# Patient Record
Sex: Male | Born: 1970 | Race: White | Hispanic: No | Smoking: Current every day smoker
Health system: Southern US, Community
[De-identification: ages and names within clinical notes are randomized; demographics above are authoritative.]

## PROBLEM LIST (undated history)

## (undated) DIAGNOSIS — F419 Anxiety disorder, unspecified: Secondary | ICD-10-CM

## (undated) DIAGNOSIS — S42009A Fracture of unspecified part of unspecified clavicle, initial encounter for closed fracture: Secondary | ICD-10-CM

## (undated) DIAGNOSIS — S2239XA Fracture of one rib, unspecified side, initial encounter for closed fracture: Secondary | ICD-10-CM

## (undated) DIAGNOSIS — F329 Major depressive disorder, single episode, unspecified: Secondary | ICD-10-CM

## (undated) DIAGNOSIS — E663 Overweight: Secondary | ICD-10-CM

## (undated) DIAGNOSIS — R7303 Prediabetes: Secondary | ICD-10-CM

## (undated) DIAGNOSIS — G4700 Insomnia, unspecified: Secondary | ICD-10-CM

## (undated) DIAGNOSIS — Z720 Tobacco use: Secondary | ICD-10-CM

## (undated) DIAGNOSIS — I10 Essential (primary) hypertension: Secondary | ICD-10-CM

## (undated) DIAGNOSIS — F32A Depression, unspecified: Secondary | ICD-10-CM

## (undated) HISTORY — DX: Insomnia, unspecified: G47.00

## (undated) HISTORY — DX: Anxiety disorder, unspecified: F41.9

## (undated) HISTORY — PX: NO PAST SURGERIES: SHX2092

## (undated) HISTORY — DX: Fracture of unspecified part of unspecified clavicle, initial encounter for closed fracture: S42.009A

## (undated) HISTORY — DX: Major depressive disorder, single episode, unspecified: F32.9

## (undated) HISTORY — DX: Overweight: E66.3

## (undated) HISTORY — DX: Prediabetes: R73.03

## (undated) HISTORY — DX: Fracture of one rib, unspecified side, initial encounter for closed fracture: S22.39XA

## (undated) HISTORY — DX: Tobacco use: Z72.0

## (undated) HISTORY — DX: Depression, unspecified: F32.A

---

## 2009-03-08 DIAGNOSIS — F341 Dysthymic disorder: Secondary | ICD-10-CM | POA: Insufficient documentation

## 2010-05-19 ENCOUNTER — Encounter: Payer: Self-pay | Admitting: Emergency Medicine

## 2010-05-19 ENCOUNTER — Ambulatory Visit (INDEPENDENT_AMBULATORY_CARE_PROVIDER_SITE_OTHER): Payer: Commercial Indemnity | Admitting: Emergency Medicine

## 2010-05-19 DIAGNOSIS — J209 Acute bronchitis, unspecified: Secondary | ICD-10-CM

## 2010-05-23 NOTE — Assessment & Plan Note (Signed)
Summary: COUGH,CONGESTION/TJ   Vital Signs:  Patient Profile:   40 Years Old Male CC:      productive cough, runny nose, HA, congestion x 4 days Height:     68 inches Weight:      174 pounds O2 Sat:      97 % O2 treatment:    Room Air Temp:     98.8 degrees F oral Pulse rate:   110 / minute Resp:     16 per minute BP sitting:   117 / 81  (left arm) Cuff size:   regular  Vitals Entered By: Lajean Saver RN (May 19, 2010 7:46 PM)                  Updated Prior Medication List: NYQUIL 60-7.08-22-998 MG/30ML LIQD (PSEUDOEPH-DOXYLAMINE-DM-APAP)  DAYQUIL MULTI-SYMPTOM 30-325-10 MG/15ML LIQD (PSEUDOEPHEDRINE-APAP-DM)   Current Allergies: No known allergies History of Present Illness History from: patient Chief Complaint: productive cough, runny nose, HA, congestion x 4 days History of Present Illness: 40 Years Old Male complains of onset of cold symptoms for 4 days.  Danny Lyons has been using OTC cold meds which is helping a little bit. No sore throat ++ cough No pleuritic pain + wheezing No nasal congestion + post-nasal drainage + sinus pain/pressure + chest congestion No itchy/red eyes No earache No hemoptysis No SOB No chills/sweats No fever No nausea No vomiting No abdominal pain No diarrhea No skin rashes No fatigue No myalgias No headache   REVIEW OF SYSTEMS Constitutional Symptoms      Denies fever, chills, night sweats, weight loss, weight gain, and fatigue.  Eyes       Denies change in vision, eye pain, eye discharge, glasses, contact lenses, and eye surgery. Ear/Nose/Throat/Mouth       Complains of frequent runny nose, sinus problems, and hoarseness.      Denies hearing loss/aids, change in hearing, ear pain, ear discharge, dizziness, frequent nose bleeds, sore throat, and tooth pain or bleeding.  Respiratory       Complains of productive cough.      Denies dry cough, wheezing, shortness of breath, asthma, bronchitis, and emphysema/COPD.    Cardiovascular       Complains of chest pain.      Denies murmurs and tires easily with exhertion.      Comments: pain with cough   Gastrointestinal       Denies stomach pain, nausea/vomiting, diarrhea, constipation, blood in bowel movements, and indigestion. Genitourniary       Denies painful urination, kidney stones, and loss of urinary control. Neurological       Complains of headaches.      Denies paralysis, seizures, and fainting/blackouts. Musculoskeletal       Denies muscle pain, joint pain, joint stiffness, decreased range of motion, redness, swelling, muscle weakness, and gout.  Skin       Denies bruising, unusual mles/lumps or sores, and hair/skin or nail changes.  Psych       Denies mood changes, temper/anger issues, anxiety/stress, speech problems, depression, and sleep problems.  Past History:  Past Medical History: Unremarkable  Past Surgical History: Denies surgical history  Social History: Current Smoker 1PPD Alcohol use-no Drug use-no Smoking Status:  current Drug Use:  no Physical Exam General appearance: well developed, well nourished, mild distress, harsh cough Ears: normal, no lesions or deformities Nasal: clear discharge Oral/Pharynx: pharyngeal erythema without exudate, uvula midline without deviation Chest/Lungs: no rales, wheezes, or rhonchi bilateral, breath sounds equal without effort.  no evidence of pneumonia heard Heart: regular rate and  rhythm, no murmur MSE: oriented to time, place, and person Assessment New Problems: BRONCHITIS, ACUTE (ICD-466.0)  Patient feels improved after breathing treatment.  Less coughing.  Patient Education: Patient and/or caregiver instructed in the following: quit smoking.  Plan New Medications/Changes: PREDNISONE (PAK) 10 MG TABS (PREDNISONE) 6 day pack, use as directed  #1 x 0, 05/19/2010, Hoyt Koch MD ZUTRIPRO 60-4-5 MG/5ML SOLN (PSEUDOEPH-CHLORPHEN-HYDROCOD) 5cc q6 hrs prn  #6oz x 0,  05/19/2010, Hoyt Koch MD ZITHROMAX Z-PAK 250 MG TABS (AZITHROMYCIN) use as directed  #1 x 0, 05/19/2010, Hoyt Koch MD  New Orders: New Patient Level III [id number] Solumedrol up to 125mg  [J2930] Pulse Oximetry (single measurment) [94760] Nebulizer Tx [91478] Admin of Therapeutic Inj  intramuscular or subcutaneous [96372] Planning Comments:   1)  Take the prescribed antibiotic as instructed. 2)  Use nasal saline solution (over the counter) at least 3 times a day. 3)  Use over the counter decongestants like Zyrtec-D every 12 hours as needed to help with congestion. 4)  Can take tylenol every 6 hours or motrin every 8 hours for pain or fever. 5)  Follow up with your primary doctor  if no improvement in 5-7 days, sooner if increasing pain, fever, or new symptoms.   If he calls back Sunday, can give work note for Monday If not improving, consider CXR at that time   The patient and/or caregiver has been counseled thoroughly with regard to medications prescribed including dosage, schedule, interactions, rationale for use, and possible side effects and they verbalize understanding.  Diagnoses and expected course of recovery discussed and will return if not improved as expected or if the condition worsens. Patient and/or caregiver verbalized understanding.  Prescriptions: PREDNISONE (PAK) 10 MG TABS (PREDNISONE) 6 day pack, use as directed  #1 x 0   Entered and Authorized by:   Hoyt Koch MD   Signed by:   Hoyt Koch MD on 05/19/2010   Method used:   Print then Give to Patient   RxID:   [id number] ZUTRIPRO 60-4-5 MG/5ML SOLN (PSEUDOEPH-CHLORPHEN-HYDROCOD) 5cc q6 hrs prn  #6oz x 0   Entered and Authorized by:   Hoyt Koch MD   Signed by:   Hoyt Koch MD on 05/19/2010   Method used:   Print then Give to Patient   RxID:   [id number] VOZDGUYQI Z-PAK 250 MG TABS (AZITHROMYCIN) use as directed  #1 x 0   Entered and Authorized by:   Hoyt Koch MD   Signed by:   Hoyt Koch MD on 05/19/2010   Method used:   Print then Give to Patient   RxID:   [id number]   Medication Administration  Injection # 1:    Medication: Solumedrol up to 125mg     Diagnosis: BRONCHITIS, ACUTE (ICD-466.0)    Route: IM    Site: LUOQ gluteus    Exp Date: 09/23/2012    Lot #: [id number]    Mfr: pfizer    Patient tolerated injection without complications    Given by: Lajean Saver RN (May 19, 2010 7:57 PM)  Medication # 1:    Medication: Albuterol Sulfate Sol 1mg  unit dose    Diagnosis: BRONCHITIS, ACUTE (ICD-466.0)    Dose: 3mg     Route: intranasal    Exp Date: 01/25/2011    Lot #: [id number]    Mfr: mylan    Patient tolerated medication without complications    Given by: Lajean Saver RN (May 19, 2010  7:58 PM)  Orders Added: 1)  New Patient Level III [99203] 2)  Solumedrol up to 125mg  [J2930] 3)  Pulse Oximetry (single measurment) [94760] 4)  Nebulizer Tx [94640] 5)  Admin of Therapeutic Inj  intramuscular or subcutaneous [19147]

## 2010-06-10 ENCOUNTER — Other Ambulatory Visit: Payer: Self-pay | Admitting: Family Medicine

## 2010-06-10 ENCOUNTER — Inpatient Hospital Stay (INDEPENDENT_AMBULATORY_CARE_PROVIDER_SITE_OTHER)
Admission: RE | Admit: 2010-06-10 | Discharge: 2010-06-10 | Disposition: A | Discharge status: Home / Self Care | Payer: Commercial Indemnity | Source: Ambulatory Visit | Attending: Family Medicine | Admitting: Family Medicine

## 2010-06-10 ENCOUNTER — Ambulatory Visit
Admission: RE | Admit: 2010-06-10 | Discharge: 2010-06-10 | Disposition: A | Discharge status: Home / Self Care | Payer: Commercial Indemnity | Source: Ambulatory Visit | Attending: Family Medicine | Admitting: Family Medicine

## 2010-06-10 ENCOUNTER — Encounter: Payer: Self-pay | Admitting: Family Medicine

## 2010-06-10 DIAGNOSIS — J209 Acute bronchitis, unspecified: Secondary | ICD-10-CM

## 2010-06-10 DIAGNOSIS — J42 Unspecified chronic bronchitis: Secondary | ICD-10-CM

## 2010-06-12 ENCOUNTER — Encounter: Payer: Self-pay | Admitting: Family Medicine

## 2010-06-12 ENCOUNTER — Telehealth (INDEPENDENT_AMBULATORY_CARE_PROVIDER_SITE_OTHER): Payer: Self-pay | Admitting: *Deleted

## 2010-06-13 NOTE — Assessment & Plan Note (Signed)
Summary: not better/TM rm 3   Vital Signs:  Patient Profile:   40 Years Old Male CC:      cough/ seen 05/19/10 no better Height:     68 inches Weight:      177.50 pounds O2 Sat:      98 % O2 treatment:    Room Air Temp:     98.1 degrees F oral Pulse rate:   85 / minute Resp:     20 per minute BP sitting:   134 / 97  (left arm) Cuff size:   regular  Vitals Entered By: Clemens Catholic LPN (June 10, 2010 9:07 AM)                  Updated Prior Medication List: No Medications Current Allergies (reviewed today): No known allergies History of Present Illness Chief Complaint: cough/ seen 05/19/10 no better History of Present Illness:  Subjective:  Patient complains of persistent cough, partly productive, not improved since previous visit.  Reports chills and anterior burning sensation in chest.  REVIEW OF SYSTEMS Constitutional Symptoms      Denies fever, chills, night sweats, weight loss, weight gain, and fatigue.  Eyes       Denies change in vision, eye pain, eye discharge, glasses, contact lenses, and eye surgery. Ear/Nose/Throat/Mouth       Complains of frequent runny nose, sinus problems, sore throat, and hoarseness.      Denies hearing loss/aids, change in hearing, ear pain, ear discharge, dizziness, frequent nose bleeds, and tooth pain or bleeding.  Respiratory       Complains of productive cough, wheezing, and shortness of breath.      Denies dry cough, asthma, bronchitis, and emphysema/COPD.  Cardiovascular       Complains of chest pain.      Denies murmurs and tires easily with exhertion.    Gastrointestinal       Denies stomach pain, nausea/vomiting, diarrhea, constipation, blood in bowel movements, and indigestion. Genitourniary       Denies painful urination, kidney stones, and loss of urinary control. Neurological       Denies paralysis, seizures, and fainting/blackouts. Musculoskeletal       Denies muscle pain, joint pain, joint stiffness, decreased range  of motion, redness, swelling, muscle weakness, and gout.  Skin       Denies bruising, unusual mles/lumps or sores, and hair/skin or nail changes.  Psych       Denies mood changes, temper/anger issues, anxiety/stress, speech problems, depression, and sleep problems. Other Comments: pt states that he was seen 05/19/10 and he is no better. still has a productive cough(green phelgm), hoarseness, and his chest is tight. no fever, no OTC meds.    Past History:  Past Medical History: Reviewed history from 05/19/2010 and no changes required. Unremarkable  Past Surgical History: Reviewed history from 05/19/2010 and no changes required. Denies surgical history  Family History: Family History Diabetes 1st degree relative MI  Social History: Current Smoker1/2 PPD Alcohol use-no Drug use-no   Objective:  No acute distress  Eyes:  Pupils are equal, round, and reactive to light and accomdation.  Extraocular movement is intact.  Conjunctivae are not inflamed.  Ears:  Canals normal.  Tympanic membranes normal.   Nose:  Moderately congested; no sinus tenderness Pharynx:  Normal  Neck:  Supple.  No adenopathy is present.   Lungs:  Clear to auscultation.  Breath sounds are equal.  Heart:  Regular rate and rhythm without murmurs, rubs,  or gallops.  Abdomen:  Nontender without masses or hepatosplenomegaly.  Bowel sounds are present.  No CVA or flank tenderness.  Extremities:  No edema.  CBC:  WBC 6.3; Hgb 15.4 Chest X-ray:   IMPRESSION: No active cardiopulmonary disease. Assessment  Assessed BRONCHITIS, ACUTE as deteriorated - Donna Christen MD New Problems: FAMILY HISTORY DIABETES 1ST DEGREE RELATIVE (ICD-V18.0)  ? PERTUSSIS  Plan New Medications/Changes: BENZONATATE 200 MG CAPS (BENZONATATE) One by mouth hs as needed cough  #12 x 0, 06/10/2010, Donna Christen MD CLARITHROMYCIN 500 MG TABS (CLARITHROMYCIN) One Tab by mouth two times a day  #20 x 0, 06/10/2010, Donna Christen MD  New  Orders: CBC w/Diff [04540-98119] T-Chest x-ray, 2 views [71020] Pulse Oximetry (single measurment) [94760] Est. Patient Level III [14782] Planning Comments:   Begin Biaxin, expectorant/decongestant, cough suppressant at bedtime.  Increase fluid intake Followup with PCP if not improving 7 to 10 days   The patient and/or caregiver has been counseled thoroughly with regard to medications prescribed including dosage, schedule, interactions, rationale for use, and possible side effects and they verbalize understanding.  Diagnoses and expected course of recovery discussed and will return if not improved as expected or if the condition worsens. Patient and/or caregiver verbalized understanding.  Prescriptions: BENZONATATE 200 MG CAPS (BENZONATATE) One by mouth hs as needed cough  #12 x 0   Entered and Authorized by:   Donna Christen MD   Signed by:   Donna Christen MD on 06/10/2010   Method used:   Print then Give to Patient   RxID:   [id number] CLARITHROMYCIN 500 MG TABS (CLARITHROMYCIN) One Tab by mouth two times a day  #20 x 0   Entered and Authorized by:   Donna Christen MD   Signed by:   Donna Christen MD on 06/10/2010   Method used:   Print then Give to Patient   RxID:   [id number]   Patient Instructions: 1)  Take Mucinex D (guaifenesin with decongestant) twice daily for congestion. 2)  Increase fluid intake, rest. 3)  May use Afrin nasal spray (or generic oxymetazoline) twice daily for about 5 days.  Also recommend using saline nasal spray several times daily and/or saline nasal irrigation. 4)  Followup with family doctor if not improving 7 to 10 days.   Orders Added: 1)  CBC w/Diff [25366-44034] 2)  T-Chest x-ray, 2 views [71020] 3)  Pulse Oximetry (single measurment) [94760] 4)  Est. Patient Level III [74259]

## 2010-06-22 NOTE — Progress Notes (Signed)
  Phone Note Outgoing Call Call back at [facility] Phone [phone]   Call placed by: Lajean Saver RN,  June 12, 2010 11:17 AM Call placed to: Patient Summary of Call: Callback: No answer. Message left with reason for call and call back with questions or concerns, finish antibiotic.

## 2013-11-23 DIAGNOSIS — R748 Abnormal levels of other serum enzymes: Secondary | ICD-10-CM | POA: Insufficient documentation

## 2013-11-23 DIAGNOSIS — E872 Acidosis, unspecified: Secondary | ICD-10-CM | POA: Insufficient documentation

## 2015-03-27 DIAGNOSIS — S42009A Fracture of unspecified part of unspecified clavicle, initial encounter for closed fracture: Secondary | ICD-10-CM

## 2015-03-27 DIAGNOSIS — S2239XA Fracture of one rib, unspecified side, initial encounter for closed fracture: Secondary | ICD-10-CM

## 2015-03-27 HISTORY — DX: Fracture of unspecified part of unspecified clavicle, initial encounter for closed fracture: S42.009A

## 2015-03-27 HISTORY — DX: Fracture of one rib, unspecified side, initial encounter for closed fracture: S22.39XA

## 2016-11-01 ENCOUNTER — Telehealth: Payer: Self-pay | Admitting: Emergency Medicine

## 2016-11-01 ENCOUNTER — Encounter: Payer: Self-pay | Admitting: *Deleted

## 2016-11-01 ENCOUNTER — Emergency Department (INDEPENDENT_AMBULATORY_CARE_PROVIDER_SITE_OTHER)
Admission: EM | Admit: 2016-11-01 | Discharge: 2016-11-01 | Disposition: A | Discharge status: Home / Self Care | Payer: Managed Care, Other (non HMO) | Source: Home / Self Care | Attending: Family Medicine | Admitting: Family Medicine

## 2016-11-01 DIAGNOSIS — G5603 Carpal tunnel syndrome, bilateral upper limbs: Secondary | ICD-10-CM

## 2016-11-01 DIAGNOSIS — B9789 Other viral agents as the cause of diseases classified elsewhere: Secondary | ICD-10-CM

## 2016-11-01 DIAGNOSIS — J069 Acute upper respiratory infection, unspecified: Secondary | ICD-10-CM

## 2016-11-01 DIAGNOSIS — H6591 Unspecified nonsuppurative otitis media, right ear: Secondary | ICD-10-CM | POA: Diagnosis not present

## 2016-11-01 HISTORY — DX: Essential (primary) hypertension: I10

## 2016-11-01 MED ORDER — GUAIFENESIN-CODEINE 100-10 MG/5ML PO SOLN: ORAL | 0 refills | Status: DC

## 2016-11-01 MED ORDER — PREDNISONE 20 MG PO TABS: ORAL_TABLET | ORAL | 0 refills | Status: DC

## 2016-11-01 MED ORDER — ONDANSETRON 4 MG PO TBDP
4.0000 mg | ORAL_TABLET | Freq: Once | ORAL | Status: AC
  Administered 2016-11-01: 4 mg via ORAL

## 2016-11-01 MED ORDER — AMOXICILLIN 875 MG PO TABS: 875.0000 mg | ORAL_TABLET | Freq: Two times a day (BID) | ORAL | 0 refills | Status: DC

## 2016-11-01 NOTE — ED Provider Notes (Addendum)
[facility]    CSN: [id number] Arrival date & time: 11/01/16  1440     History   Chief Complaint Chief Complaint  Patient presents with  . Otalgia  . Emesis    HPI Danny Lyons is a 46 y.o. male.   Patient presents with several concerns: 1)  He has pain in both wrists, worse on the right, and tingling in his hands. 2)  He complains of right ear pain for 2 to 3 days, in addition to sinus congestion, cough, fatigue, chills, and myalgias.  He complains of shortness of breath and wheezing.  He has a past history of asthma as a child.  He has had several episodes of nausea/vomiting.   The history is provided by the patient.    Past Medical History:  Diagnosis Date  . Hypertension     Patient Active Problem List   Diagnosis Date Noted  . BRONCHITIS, ACUTE 05/19/2010    History reviewed. No pertinent surgical history.     Home Medications    Prior to Admission medications   Medication Sig Start Date End Date Taking? Authorizing Provider  amoxicillin (AMOXIL) 875 MG tablet Take 1 tablet (875 mg total) by mouth 2 (two) times daily. 11/01/16   Lattie Haw, MD  guaiFENesin-codeine 100-10 MG/5ML syrup Take 10mL by mouth at bedtime as needed for cough 11/01/16   Lattie Haw, MD  predniSONE (DELTASONE) 20 MG tablet Take one tab by mouth twice daily for 5 days, then one daily. Take with food. 11/01/16   Lattie Haw, MD    Family History Family History  Problem Relation Age of Onset  . Diabetes Mother   . Hypertension Mother   . Heart attack Mother   . Hypertension Father   . Diabetes Father   . Heart attack Father     Social History Social History  Substance Use Topics  . Smoking status: Current Every Day Smoker    Packs/day: 1.00    Types: Cigarettes  . Smokeless tobacco: Never Used  . Alcohol use No     Allergies   Patient has no known allergies.   Review of Systems Review of Systems  No sore throat + cough No pleuritic pain +  wheezing + nasal congestion + post-nasal drainage + sinus pain/pressure No itchy/red eyes + earache No hemoptysis No SOB ? fever, + chills/sweats + nausea + vomiting No abdominal pain No diarrhea No urinary symptoms No skin rash + fatigue + myalgias + headache + bilateral wrist pain Used OTC meds without relief    Physical Exam Triage Vital Signs ED Triage Vitals [11/01/16 1511]  Enc Vitals Group     BP (!) 141/91     Pulse Rate (!) 102     Resp      Temp 98.3 F (36.8 C)     Temp Source Oral     SpO2 96 %     Weight 176 lb (79.8 kg)     Height      Head Circumference      Peak Flow      Pain Score 8     Pain Loc      Pain Edu?      Excl. in GC?    No data found.   Updated Vital Signs BP (!) 141/91 (BP Location: Left Arm)   Pulse (!) 102   Temp 98.3 F (36.8 C) (Oral)   Wt 176 lb (79.8 kg)   SpO2 96%  BMI 26.76 kg/m   Visual Acuity Right Eye Distance:   Left Eye Distance:   Bilateral Distance:    Right Eye Near:   Left Eye Near:    Bilateral Near:     Physical Exam Nursing notes and Vital Signs reviewed. Appearance:  Patient appears stated age, and in no acute distress Eyes:  Pupils are equal, round, and reactive to light and accomodation.  Extraocular movement is intact.  Conjunctivae are not inflamed  Ears:  Canals normal.  Right tympanic membrane is erythematous with poor landmarks.  Left tympanic membrane normal. Nose:  Congested turbinates.  No sinus tenderness.    Pharynx:  Normal Neck:  Supple.  Enlarged posterior/lateral nodes are palpated bilaterally, tender to palpation on the left.   Lungs:  Course breath sounds.  Breath sounds are equal.  Moving air well. Heart:  Regular rate and rhythm without murmurs, rubs, or gallops.  Abdomen:  Nontender without masses or hepatosplenomegaly.  Bowel sounds are present.  No CVA or flank tenderness.  Extremities:  Tenderness to palpation both wrists with decreased range of motion.  Tinel's and  Phalen's tests positive right wrist. Skin:  No rash present.    UC Treatments / Results  Labs (all labs ordered are listed, but only abnormal results are displayed) Labs Reviewed - No data to display  EKG  EKG Interpretation None       Radiology No results found.  Procedures Procedures (including critical care time)  Medications Ordered in UC Medications  ondansetron (ZOFRAN-ODT) disintegrating tablet 4 mg (4 mg Oral Given 11/01/16 1515)     Initial Impression / Assessment and Plan / UC Course  I have reviewed the triage vital signs and the nursing notes.  Pertinent labs & imaging results that were available during my care of the patient were reviewed by me and considered in my medical decision making (see chart for details).     Administered Zofran ODT 4mg  PO Patient has a past history of asthma as a child.  Begin prednisone burst/taper. Begin amoxicillin 875mg  BID.  Rx for Robitussin AC for night time cough.  Dispensed bilateral wrist splints.    Take plain guaifenesin (1200mg  extended release tabs such as Mucinex) twice daily, with plenty of water, for cough and congestion.  Get adequate rest.   May use Afrin nasal spray (or generic oxymetazoline) each morning for about 5 days and then discontinue.  Also recommend using saline nasal spray several times daily and saline nasal irrigation (AYR is a common brand).  Use Flonase nasal spray each morning after using Afrin nasal spray and saline nasal irrigation. Try warm salt water gargles for sore throat.  Stop all antihistamines for now, and other non-prescription cough/cold preparations.   Apply ice pack to wrists for 15 to 20 minutes, 2 to 3 times daily  Continue until pain and swelling decrease.   Followup with Dr. Rodney Langton or Dr. Clementeen [facility] ([facility]) for further management of chronic problems     Final Clinical Impressions(s) / UC Diagnoses   Final diagnoses:  Right otitis media with  effusion  Viral URI with cough  Bilateral carpal tunnel syndrome    New Prescriptions New Prescriptions   AMOXICILLIN (AMOXIL) 875 MG TABLET    Take 1 tablet (875 mg total) by mouth 2 (two) times daily.   GUAIFENESIN-CODEINE 100-10 MG/5ML SYRUP    Take 10mL by mouth at bedtime as needed for cough   PREDNISONE (DELTASONE) 20 MG TABLET  Take one tab by mouth twice daily for 5 days, then one daily. Take with food.   Controlled Substance Prescriptions I have consulted the [place] Controlled Substances Registry for this patient, and feel the risk/benefit ratio today is favorable for proceeding with this prescription for a controlled substance.       Lattie HawBeese, Aavya Shafer A, MD 11/12/16 1507    Lattie HawBeese, Louvina Cleary A, MD 11/12/16 [phone]

## 2016-11-01 NOTE — Telephone Encounter (Signed)
Zofran called to [facility] [place]. AP,CMA

## 2016-11-01 NOTE — ED Triage Notes (Signed)
Patient c/o right ear pain x 3 days. Vomiting and cough x 2 days. C/o chills and sweats.

## 2016-11-01 NOTE — Discharge Instructions (Signed)
Take plain guaifenesin (1200mg  extended release tabs such as Mucinex) twice daily, with plenty of water, for cough and congestion.  Get adequate rest.   May use Afrin nasal spray (or generic oxymetazoline) each morning for about 5 days and then discontinue.  Also recommend using saline nasal spray several times daily and saline nasal irrigation (AYR is a common brand).  Use Flonase nasal spray each morning after using Afrin nasal spray and saline nasal irrigation. Try warm salt water gargles for sore throat.  Stop all antihistamines for now, and other non-prescription cough/cold preparations.   Apply ice pack to wrists for 15 to 20 minutes, 2 to 3 times daily  Continue until pain and swelling decrease.

## 2016-11-08 ENCOUNTER — Emergency Department (INDEPENDENT_AMBULATORY_CARE_PROVIDER_SITE_OTHER)
Admission: EM | Admit: 2016-11-08 | Discharge: 2016-11-08 | Disposition: A | Discharge status: Home / Self Care | Payer: Managed Care, Other (non HMO) | Source: Home / Self Care | Attending: Family Medicine | Admitting: Family Medicine

## 2016-11-08 ENCOUNTER — Encounter: Payer: Self-pay | Admitting: Emergency Medicine

## 2016-11-08 DIAGNOSIS — H6691 Otitis media, unspecified, right ear: Secondary | ICD-10-CM | POA: Diagnosis not present

## 2016-11-08 DIAGNOSIS — J040 Acute laryngitis: Secondary | ICD-10-CM

## 2016-11-08 MED ORDER — GUAIFENESIN-CODEINE 100-10 MG/5ML PO SYRP: 10.0000 mL | ORAL_SOLUTION | Freq: Two times a day (BID) | ORAL | 0 refills | Status: DC | PRN

## 2016-11-08 MED ORDER — CEFTRIAXONE SODIUM 1 G IJ SOLR
1.0000 g | Freq: Once | INTRAMUSCULAR | Status: AC
  Administered 2016-11-08: 1 g via INTRAMUSCULAR

## 2016-11-08 MED ORDER — KETOROLAC TROMETHAMINE 60 MG/2ML IM SOLN
60.0000 mg | Freq: Once | INTRAMUSCULAR | Status: AC
  Administered 2016-11-08: 60 mg via INTRAMUSCULAR

## 2016-11-08 MED ORDER — PREDNISONE 20 MG PO TABS: ORAL_TABLET | ORAL | 0 refills | Status: DC

## 2016-11-08 MED ORDER — CEFDINIR 300 MG PO CAPS: 300.0000 mg | ORAL_CAPSULE | Freq: Two times a day (BID) | ORAL | 0 refills | Status: DC

## 2016-11-08 NOTE — ED Provider Notes (Signed)
[facility]    CSN: [id number] Arrival date & time: 11/08/16  4098     History   Chief Complaint Chief Complaint  Patient presents with  . Otalgia    HPI Danny Lyons is a 46 y.o. male.   HPI  Danny Lyons is a 46 y.o. male presenting to UC with c/o worsening Right ear pain and now lost his voice over the last 1 week. Pt was seen at [facility] on 11/01/16, dx with Right AOM and viral cough. He was prescribed Amoxicillin 875mg , prednisone and guaifenesin-codeine (Virtussin).  He states the pain his in his ear has only worsened. Hx of recurrent Right ear infections. Last one before 1 week ago was a few months ago. Denies fever but has had chills. Denies n/v/d. He has had fatigue. He had to call out of work today and plans to be out tomorrow as well due to feeling so bad.   Past Medical History:  Diagnosis Date  . Hypertension     Patient Active Problem List   Diagnosis Date Noted  . BRONCHITIS, ACUTE 05/19/2010    History reviewed. No pertinent surgical history.     Home Medications    Prior to Admission medications   Medication Sig Start Date End Date Taking? Authorizing Provider  amoxicillin (AMOXIL) 875 MG tablet Take 1 tablet (875 mg total) by mouth 2 (two) times daily. 11/01/16   Lattie Haw, MD  cefdinir (OMNICEF) 300 MG capsule Take 1 capsule (300 mg total) by mouth 2 (two) times daily. 11/08/16   Lurene Shadow, PA-C  guaiFENesin-codeine (ROBITUSSIN AC) 100-10 MG/5ML syrup Take 10 mLs by mouth 2 (two) times daily as needed for cough. 11/08/16   Lurene Shadow, PA-C  guaiFENesin-codeine 100-10 MG/5ML syrup Take 10mL by mouth at bedtime as needed for cough 11/01/16   Lattie Haw, MD  predniSONE (DELTASONE) 20 MG tablet 3 tabs po day one, then 2 po daily x 4 days 11/08/16   Lurene Shadow, PA-C    Family History Family History  Problem Relation Age of Onset  . Diabetes Mother   . Hypertension Mother   . Heart attack Mother   . Hypertension Father   .  Diabetes Father   . Heart attack Father     Social History Social History  Substance Use Topics  . Smoking status: Current Every Day Smoker    Packs/day: 1.00    Types: Cigarettes  . Smokeless tobacco: Never Used  . Alcohol use No     Allergies   Patient has no known allergies.   Review of Systems Review of Systems  Constitutional: Positive for chills and fatigue. Negative for fever.  HENT: Positive for congestion, ear pain (Right), postnasal drip and voice change. Negative for sore throat and trouble swallowing.   Respiratory: Positive for cough. Negative for shortness of breath.   Cardiovascular: Negative for chest pain and palpitations.  Gastrointestinal: Negative for abdominal pain, diarrhea, nausea and vomiting.  Musculoskeletal: Negative for arthralgias, back pain and myalgias.  Skin: Negative for rash.  Neurological: Positive for headaches. Negative for dizziness and light-headedness.  All other systems reviewed and are negative.    Physical Exam Triage Vital Signs ED Triage Vitals  Enc Vitals Group     BP 11/08/16 1024 129/84     Pulse Rate 11/08/16 1024 80     Resp --      Temp 11/08/16 1024 98.1 F (36.7 C)     Temp Source 11/08/16  1024 Oral     SpO2 11/08/16 1024 98 %     Weight 11/08/16 1024 174 lb (78.9 kg)     Height --      Head Circumference --      Peak Flow --      Pain Score 11/08/16 1025 5     Pain Loc --      Pain Edu? --      Excl. in GC? --    No data found.   Updated Vital Signs BP 129/84 (BP Location: Right Arm)   Pulse 80   Temp 98.1 F (36.7 C) (Oral)   Wt 174 lb (78.9 kg)   SpO2 98%   BMI 26.46 kg/m   Visual Acuity Right Eye Distance:   Left Eye Distance:   Bilateral Distance:    Right Eye Near:   Left Eye Near:    Bilateral Near:     Physical Exam  Constitutional: He is oriented to person, place, and time. He appears well-developed and well-nourished. No distress.  HENT:  Head: Normocephalic and atraumatic.    Right Ear: Tympanic membrane is erythematous and bulging. Tympanic membrane is not perforated.  Left Ear: Tympanic membrane normal.  Nose: Nose normal.  Mouth/Throat: Uvula is midline, oropharynx is clear and moist and mucous membranes are normal.  Eyes: EOM are normal.  Neck: Normal range of motion. Neck supple.  Hoarse voice but no stridor.  Cardiovascular: Normal rate and regular rhythm.   Pulmonary/Chest: Effort normal and breath sounds normal. No stridor. No respiratory distress. He has no wheezes. He has no rales.  Musculoskeletal: Normal range of motion.  Lymphadenopathy:    He has cervical adenopathy (Right preauricular).  Neurological: He is alert and oriented to person, place, and time.  Skin: Skin is warm and dry. He is not diaphoretic.  Psychiatric: He has a normal mood and affect. His behavior is normal.  Nursing note and vitals reviewed.    UC Treatments / Results  Labs (all labs ordered are listed, but only abnormal results are displayed) Labs Reviewed - No data to display  EKG  EKG Interpretation None       Radiology No results found.  Procedures Procedures (including critical care time)  Medications Ordered in UC Medications  cefTRIAXone (ROCEPHIN) injection 1 g (1 g Intramuscular Given 11/08/16 1045)  ketorolac (TORADOL) injection 60 mg (60 mg Intramuscular Given 11/08/16 1046)     Initial Impression / Assessment and Plan / UC Course  I have reviewed the triage vital signs and the nursing notes.  Pertinent labs & imaging results that were available during my care of the patient were reviewed by me and considered in my medical decision making (see chart for details).     Pt c/o worsening symptoms despite taking his Amoxicillin as prescribed since 11/01/16.    Rocephin 1g IM given in UC   Final Clinical Impressions(s) / UC Diagnoses   Final diagnoses:  Right acute otitis media  Laryngitis   Will refill prednisone for 1 week, change  antibiotic to Cefdinir and refill small amount, 60mL of the Virtussin.  Work note provided for today and tomorrow. Encouraged fluids and rest, acetaminophen and ibuprofen F/u with PCP in 1 week if not improving Due to reports of recurrent infections in Right ear, resource guide for ENT also provided.   New Prescriptions Discharge Medication List as of 11/08/2016 10:40 AM    START taking these medications   Details  cefdinir (OMNICEF) 300 MG capsule  Take 1 capsule (300 mg total) by mouth 2 (two) times daily., Starting Thu 11/08/2016, Normal    !! guaiFENesin-codeine (ROBITUSSIN AC) 100-10 MG/5ML syrup Take 10 mLs by mouth 2 (two) times daily as needed for cough., Starting Thu 11/08/2016, Print     !! - Potential duplicate medications found. Please discuss with provider.       Controlled Substance Prescriptions [place] Controlled Substance Registry consulted? Yes, I have consulted the [place] Controlled Substances Registry for this patient, and feel the risk/benefit ratio today is favorable for proceeding with this prescription for a controlled substance.   Lurene Shadowhelps, Brinly Maietta O, [place]PA-C 11/08/16 1143

## 2016-11-08 NOTE — Discharge Instructions (Signed)
°  Virtussin (guaifenesin-codeine) is a strong narcotic cough medication.  Only take up to 3 times daily as needed for severe cough.  Be sure to take with large glass of water.  It may cause drowsiness. Do not drive, drink alcohol or take other sedating medications such as Nyquil while taking this medication.   You may take 500mg  acetaminophen every 4-6 hours or in combination with ibuprofen 400-600mg  every 6-8 hours as needed for pain and inflammation.

## 2016-11-08 NOTE — ED Triage Notes (Signed)
Pt seen 11/01/16 with c/o right ear pain, cough and chills. States sxs have worsened. He is taking amox, prednisone and cough syrup with no relief. Now c/o laryngitis.

## 2016-11-09 ENCOUNTER — Ambulatory Visit (INDEPENDENT_AMBULATORY_CARE_PROVIDER_SITE_OTHER): Payer: Managed Care, Other (non HMO) | Admitting: Physician Assistant

## 2016-11-09 ENCOUNTER — Encounter: Payer: Self-pay | Admitting: Physician Assistant

## 2016-11-09 VITALS — BP 164/107 | HR 71 | Temp 99.1°F | Ht 68.0 in | Wt 170.0 lb

## 2016-11-09 DIAGNOSIS — I10 Essential (primary) hypertension: Secondary | ICD-10-CM

## 2016-11-09 DIAGNOSIS — Z87898 Personal history of other specified conditions: Secondary | ICD-10-CM | POA: Diagnosis not present

## 2016-11-09 DIAGNOSIS — Z13220 Encounter for screening for lipoid disorders: Secondary | ICD-10-CM

## 2016-11-09 DIAGNOSIS — Z136 Encounter for screening for cardiovascular disorders: Secondary | ICD-10-CM

## 2016-11-09 DIAGNOSIS — R509 Fever, unspecified: Secondary | ICD-10-CM

## 2016-11-09 DIAGNOSIS — F331 Major depressive disorder, recurrent, moderate: Secondary | ICD-10-CM

## 2016-11-09 DIAGNOSIS — F17210 Nicotine dependence, cigarettes, uncomplicated: Secondary | ICD-10-CM

## 2016-11-09 DIAGNOSIS — Z833 Family history of diabetes mellitus: Secondary | ICD-10-CM | POA: Diagnosis not present

## 2016-11-09 DIAGNOSIS — Z7689 Persons encountering health services in other specified circumstances: Secondary | ICD-10-CM

## 2016-11-09 DIAGNOSIS — Z8249 Family history of ischemic heart disease and other diseases of the circulatory system: Secondary | ICD-10-CM

## 2016-11-09 LAB — POCT GLYCOSYLATED HEMOGLOBIN (HGB A1C): HEMOGLOBIN A1C: 5.7

## 2016-11-09 MED ORDER — CITALOPRAM HYDROBROMIDE 20 MG PO TABS: 20.0000 mg | ORAL_TABLET | Freq: Every day | ORAL | 1 refills | Status: DC

## 2016-11-09 MED ORDER — CANDESARTAN CILEXETIL 32 MG PO TABS: 32.0000 mg | ORAL_TABLET | Freq: Every day | ORAL | 1 refills | Status: DC

## 2016-11-09 NOTE — Patient Instructions (Addendum)
For your blood pressure: - Start Candesartan every morning - Limit salt to 1500mg  per day - Reduce number of cigarettes your are smoking - Limit alcohol to no more than 1 standard drink per day - Follow DASH eating plan - Follow-up in 4 weeks   For mood: - start Celexa every morning or at bedtime - Follow-up in 4 weeks  For prediabetes: - measure and limit your carbohydrates - avoid sugary beverages - regular cardiovascular exercise  Please go to the lab for fasting blood work before your follow-up appointment The lab is a walk-in open M-F 7:30a-4:30p (closed 12:30-1:30p). Nothing to eat or drink after midnight or at least 8 hours before your blood draw. You can have water and your medications.   Schedule an appointment with Sports Medicine for your shoulder pain and arm numbness  Prediabetes Eating Plan Prediabetes-also called impaired glucose tolerance or impaired fasting glucose-is a condition that causes blood sugar (blood glucose) levels to be higher than normal. Following a healthy diet can help to keep prediabetes under control. It can also help to lower the risk of type 2 diabetes and heart disease, which are increased in people who have prediabetes. Along with regular exercise, a healthy diet:  Promotes weight loss.  Helps to control blood sugar levels.  Helps to improve the way that the body uses insulin.  What do I need to know about this eating plan?  Use the glycemic index (GI) to plan your meals. The index tells you how quickly a food will raise your blood sugar. Choose low-GI foods. These foods take a longer time to raise blood sugar.  Pay close attention to the amount of carbohydrates in the food that you eat. Carbohydrates increase blood sugar levels.  Keep track of how many calories you take in. Eating the right amount of calories will help you to achieve a healthy weight. Losing about 7 percent of your starting weight can help to prevent type 2 diabetes.  You  may want to follow a Mediterranean diet. This diet includes a lot of vegetables, lean meats or fish, whole grains, fruits, and healthy oils and fats. What foods can I eat? Grains Whole grains, such as whole-wheat or whole-grain breads, crackers, cereals, and pasta. Unsweetened oatmeal. Bulgur. Barley. Quinoa. Brown rice. Corn or whole-wheat flour tortillas or taco shells. Vegetables Lettuce. Spinach. Peas. Beets. Cauliflower. Cabbage. Broccoli. Carrots. Tomatoes. Squash. Eggplant. Herbs. Peppers. Onions. Cucumbers. Brussels sprouts. Fruits Berries. Bananas. Apples. Oranges. Grapes. Papaya. Mango. Pomegranate. Kiwi. Grapefruit. Cherries. Meats and Other Protein Sources Seafood. Lean meats, such as chicken and [place] or lean cuts of pork and beef. Tofu. Eggs. Nuts. Beans. Dairy Low-fat or fat-free dairy products, such as yogurt, cottage cheese, and cheese. Beverages Water. Tea. Coffee. Sugar-free or diet soda. Seltzer water. Milk. Milk alternatives, such as soy or almond milk. Condiments Mustard. Relish. Low-fat, low-sugar ketchup. Low-fat, low-sugar barbecue sauce. Low-fat or fat-free mayonnaise. Sweets and Desserts Sugar-free or low-fat pudding. Sugar-free or low-fat ice cream and other frozen treats. Fats and Oils Avocado. Walnuts. Olive oil. The items listed above may not be a complete list of recommended foods or beverages. Contact your dietitian for more options. What foods are not recommended? Grains Refined white flour and flour products, such as bread, pasta, snack foods, and cereals. Beverages Sweetened drinks, such as sweet iced tea and soda. Sweets and Desserts Baked goods, such as cake, cupcakes, pastries, cookies, and cheesecake. The items listed above may not be a complete list of foods and  beverages to avoid. Contact your dietitian for more information. This information is not intended to replace advice given to you by your health care provider. Make sure you discuss any  questions you have with your health care provider. Document Released: 07/27/2014 Document Revised: 08/18/2015 Document Reviewed: 04/07/2014 Elsevier Interactive Patient Education  2017 Elsevier Inc.  Managing Your Hypertension Hypertension is commonly called high blood pressure. This is when the force of your blood pressing against the walls of your arteries is too strong. Arteries are blood vessels that carry blood from your heart throughout your body. Hypertension forces the heart to work harder to pump blood, and may cause the arteries to become narrow or stiff. Having untreated or uncontrolled hypertension can cause heart attack, stroke, kidney disease, and other problems. What are blood pressure readings? A blood pressure reading consists of a higher number over a lower number. Ideally, your blood pressure should be below 120/80. The first ("top") number is called the systolic pressure. It is a measure of the pressure in your arteries as your heart beats. The second ("bottom") number is called the diastolic pressure. It is a measure of the pressure in your arteries as the heart relaxes. What does my blood pressure reading mean? Blood pressure is classified into four stages. Based on your blood pressure reading, your health care provider may use the following stages to determine what type of treatment you need, if any. Systolic pressure and diastolic pressure are measured in a unit called mm Hg. Normal  Systolic pressure: below 120.  Diastolic pressure: below 80. Elevated  Systolic pressure: 120-129.  Diastolic pressure: below 80. Hypertension stage 1  Systolic pressure: 130-139.  Diastolic pressure: 80-89. Hypertension stage 2  Systolic pressure: 140 or above.  Diastolic pressure: 90 or above. What health risks are associated with hypertension? Managing your hypertension is an important responsibility. Uncontrolled hypertension can lead to:  A heart attack.  A stroke.  A  weakened blood vessel (aneurysm).  Heart failure.  Kidney damage.  Eye damage.  Metabolic syndrome.  Memory and concentration problems.  What changes can I make to manage my hypertension? Hypertension can be managed by making lifestyle changes and possibly by taking medicines. Your health care provider will help you make a plan to bring your blood pressure within a normal range. Eating and drinking  Eat a diet that is high in fiber and potassium, and low in salt (sodium), added sugar, and fat. An example eating plan is called the DASH (Dietary Approaches to Stop Hypertension) diet. To eat this way: ? Eat plenty of fresh fruits and vegetables. Try to fill half of your plate at each meal with fruits and vegetables. ? Eat whole grains, such as whole wheat pasta, brown rice, or whole grain bread. Fill about one quarter of your plate with whole grains. ? Eat low-fat diary products. ? Avoid fatty cuts of meat, processed or cured meats, and poultry with skin. Fill about one quarter of your plate with lean proteins such as fish, chicken without skin, beans, eggs, and tofu. ? Avoid premade and processed foods. These tend to be higher in sodium, added sugar, and fat.  Reduce your daily sodium intake. Most people with hypertension should eat less than 1,500 mg of sodium a day.  Limit alcohol intake to no more than 1 drink a day for nonpregnant women and 2 drinks a day for men. One drink equals 12 oz of beer, 5 oz of wine, or 1 oz of hard liquor. Lifestyle  Work with your health care provider to maintain a healthy body weight, or to lose weight. Ask what an ideal weight is for you.  Get at least 30 minutes of exercise that causes your heart to beat faster (aerobic exercise) most days of the week. Activities may include walking, swimming, or biking.  Include exercise to strengthen your muscles (resistance exercise), such as weight lifting, as part of your weekly exercise routine. Try to do these  types of exercises for 30 minutes at least 3 days a week.  Do not use any products that contain nicotine or tobacco, such as cigarettes and e-cigarettes. If you need help quitting, ask your health care provider.  Control any long-term (chronic) conditions you have, such as high cholesterol or diabetes. Monitoring  Monitor your blood pressure at home as told by your health care provider. Your personal target blood pressure may vary depending on your medical conditions, your age, and other factors.  Have your blood pressure checked regularly, as often as told by your health care provider. Working with your health care provider  Review all the medicines you take with your health care provider because there may be side effects or interactions.  Talk with your health care provider about your diet, exercise habits, and other lifestyle factors that may be contributing to hypertension.  Visit your health care provider regularly. Your health care provider can help you create and adjust your plan for managing hypertension. Will I need medicine to control my blood pressure? Your health care provider may prescribe medicine if lifestyle changes are not enough to get your blood pressure under control, and if:  Your systolic blood pressure is 130 or higher.  Your diastolic blood pressure is 80 or higher.  Take medicines only as told by your health care provider. Follow the directions carefully. Blood pressure medicines must be taken as prescribed. The medicine does not work as well when you skip doses. Skipping doses also puts you at risk for problems. Contact a health care provider if:  You think you are having a reaction to medicines you have taken.  You have repeated (recurrent) headaches.  You feel dizzy.  You have swelling in your ankles.  You have trouble with your vision. Get help right away if:  You develop a severe headache or confusion.  You have unusual weakness or numbness, or  you feel faint.  You have severe pain in your chest or abdomen.  You vomit repeatedly.  You have trouble breathing. Summary  Hypertension is when the force of blood pumping through your arteries is too strong. If this condition is not controlled, it may put you at risk for serious complications.  Your personal target blood pressure may vary depending on your medical conditions, your age, and other factors. For most people, a normal blood pressure is less than 120/80.  Hypertension is managed by lifestyle changes, medicines, or both. Lifestyle changes include weight loss, eating a healthy, low-sodium diet, exercising more, and limiting alcohol. This information is not intended to replace advice given to you by your health care provider. Make sure you discuss any questions you have with your health care provider. Document Released: 12/05/2011 Document Revised: 02/08/2016 Document Reviewed: 02/08/2016 Elsevier Interactive Patient Education  [employer].

## 2016-11-09 NOTE — Progress Notes (Signed)
HPI:                                                                Danny Lyons. is a 46 y.o. male who presents to [facility]: Primary Care Sports Medicine today to establish care  Current Concerns include hypertension, depression  HTN: reports diagnosed in the past and treated with multiple medications. He cannot recall the names. Does not check BP's at home. Denies vision change, headache, chest pain with exertion, orthopnea, lightheadedness, syncope and edema. Risk factors include: male sex, tobacco use, prediabetes  Depression/Anxiety: reports he has been on Celexa and Klonopin in the past. Reports adverse reaction to Xanax. Endorses depressed mood and anhedonia. Endorses racing thoughts at night and sleep disturbance. Denies symptoms of mania/hypomania. Denies suicidal thinking. Denies auditory/visual hallucinations.  Tobacco use: smokes 1ppd for approx 20 years. He is currently being treated with antibiotics and steroids for acute bronchitis. Reports history of productive cough when he is not acutely ill. Denies wheezing, shortness of breath. Denies history of COPD.  Health Maintenance Health Maintenance  Topic Date Due  . HIV Screening  08/15/1987  . TETANUS/TDAP  08/15/1991  . INFLUENZA VACCINE  10/24/2016    Past Medical History:  Diagnosis Date  . Anxiety   . Closed rib fracture 2017   right  . Collar bone fracture 2017  . Depression   . Hypertension   . Prediabetes    Past Surgical History:  Procedure Laterality Date  . NO PAST SURGERIES     Social History  Substance Use Topics  . Smoking status: Current Every Day Smoker    Packs/day: 1.00    Years: 20.00    Types: Cigarettes  . Smokeless tobacco: Never Used  . Alcohol use No   family history includes Diabetes in his father and mother; Heart attack in his father, maternal grandfather, maternal grandmother, mother, paternal grandfather, and paternal grandmother; Hypertension in his father  and mother.  ROS: Review of Systems  Constitutional: Positive for fever and malaise/fatigue.  HENT:       Hoarse voice  Eyes: Negative.   Respiratory: Positive for cough.   Cardiovascular: Negative.   Gastrointestinal: Negative.   Genitourinary: Negative.   Musculoskeletal: Positive for joint pain (right shoulder).  Skin: Negative.   Neurological: Positive for sensory change (right arm paresthesias).  Endo/Heme/Allergies: Negative.   Psychiatric/Behavioral: Positive for depression. Negative for hallucinations and suicidal ideas. The patient is nervous/anxious and has insomnia.      Medications: Current Outpatient Prescriptions  Medication Sig Dispense Refill  . candesartan (ATACAND) 32 MG tablet Take 1 tablet (32 mg total) by mouth daily. 30 tablet 1  . cefdinir (OMNICEF) 300 MG capsule     . citalopram (CELEXA) 20 MG tablet Take 1 tablet (20 mg total) by mouth daily. 30 tablet 1  . predniSONE (DELTASONE) 20 MG tablet     . VIRTUSSIN A/C 100-10 MG/5ML syrup      No current facility-administered medications for this visit.    No Known Allergies     Objective:  BP (!) 164/107   Pulse 71   Temp 99.1 F (37.3 C)   Ht 5\' 8"  (1.727 m)   Wt 170 lb (77.1 kg)   BMI 25.85 kg/m  Gen:  alert, not ill-appearing, no distress, appropriate for age HEENT: head normocephalic without obvious abnormality, conjunctiva and cornea clear, trachea midline Pulm: Normal work of breathing, normal phonation, expiratory wheezes in the right lobes CV: Normal rate, regular rhythm, s1 and s2 distinct, no murmurs, clicks or rubs, no carotid bruit Neuro: alert and oriented x 3, no tremor MSK: extremities atraumatic, normal gait and station, no peripheral edema Skin: intact, no rashes on exposed skin, no jaundice, no cyanosis Psych: well-groomed, cooperative, good eye contact, depressed mood, affect mood-congruent, speech is articulate, and thought processes clear and goal-directed    Results for  orders placed or performed in visit on 11/09/16 (from the past 72 hour(s))  POCT HgB A1C     Status: Abnormal   Collection Time: 11/09/16  9:14 AM  Result Value Ref Range   Hemoglobin A1C 5.7    No results found.  Depression screen PHQ 2/9 11/09/2016  Decreased Interest 3  Down, Depressed, Hopeless 2  PHQ - 2 Score 5  Altered sleeping 3  Tired, decreased energy 2  Change in appetite 1  Feeling bad or failure about yourself  0  Trouble concentrating 3  Moving slowly or fidgety/restless 1  Suicidal thoughts 0  PHQ-9 Score 15     Assessment and Plan: 46 y.o. male with   1. Encounter to establish care - reviewed PMH - reviewed health maintenance - depression screen positive  2. Uncontrolled hypertension - Limit salt to 1500mg  per day - Reduce number of cigarettes your are smoking - Limit alcohol to no more than 1 standard drink per day - Follow DASH eating plan - Follow-up in 4 weeks  - candesartan (ATACAND) 32 MG tablet; Take 1 tablet (32 mg total) by mouth daily.  Dispense: 30 tablet; Refill: 1 - Comprehensive metabolic panel  3. History of prediabetes - POCT HgB A1C 5.7, consistent with prediabetes - counseled on dietary changes  4. Heavy tobacco smoker who smokes more than 10 cigarettes per day - declines smoking cessation, not ready to quit  5. Low grade fever - continue anitbiotics  6. Moderate episode of recurrent major depressive disorder (HCC) - PHQ9 score 15, moderate; no SI - citalopram (CELEXA) 20 MG tablet; Take 1 tablet (20 mg total) by mouth daily.  Dispense: 30 tablet; Refill: 1  7. Encounter for lipid screening for cardiovascular disease - Lipid Panel w/reflex Direct LDL  8. Family history of heart attack  9. Family history of diabetes mellitus in first degree relative   Patient education and anticipatory guidance given Patient agrees with treatment plan Follow-up in 4 weeks for HTN and PHQ9 or sooner as needed  Levonne Hubert  PA-C

## 2016-11-16 ENCOUNTER — Institutional Professional Consult (permissible substitution): Payer: Managed Care, Other (non HMO) | Admitting: Family Medicine

## 2016-11-16 DIAGNOSIS — Z0189 Encounter for other specified special examinations: Secondary | ICD-10-CM

## 2016-12-07 ENCOUNTER — Ambulatory Visit: Payer: Managed Care, Other (non HMO) | Admitting: Physician Assistant

## 2016-12-14 ENCOUNTER — Ambulatory Visit: Payer: Managed Care, Other (non HMO) | Admitting: Physician Assistant

## 2016-12-14 DIAGNOSIS — Z0189 Encounter for other specified special examinations: Secondary | ICD-10-CM

## 2017-01-03 ENCOUNTER — Ambulatory Visit: Payer: Managed Care, Other (non HMO) | Admitting: Physician Assistant

## 2017-01-03 LAB — LIPID PANEL W/REFLEX DIRECT LDL
CHOL/HDL RATIO: 6.7 (calc) — AB (ref <5.0)
CHOLESTEROL: 215 mg/dL — AB (ref <200)
HDL: 32 mg/dL — AB (ref >50)
LDL CHOLESTEROL (CALC): 136 mg/dL — AB
Non-HDL Cholesterol (Calc): 183 mg/dL (calc) — ABNORMAL HIGH (ref <130)
Triglycerides: 326 mg/dL — ABNORMAL HIGH (ref <150)

## 2017-01-03 LAB — COMPLETE METABOLIC PANEL WITH GFR
AG RATIO: 1.4 (calc) (ref 1.0–2.5)
ALKALINE PHOSPHATASE (APISO): 101 U/L (ref 33–115)
ALT: 27 U/L (ref 6–29)
AST: 22 U/L (ref 10–30)
Albumin: 3.9 g/dL (ref 3.6–5.1)
BUN: 9 mg/dL (ref 7–25)
CO2: 26 mmol/L (ref 20–32)
Calcium: 8.9 mg/dL (ref 8.6–10.2)
Chloride: 105 mmol/L (ref 98–110)
Creat: 0.9 mg/dL (ref 0.50–1.10)
GFR, Est African American: 93 mL/min/{1.73_m2} (ref >=60)
GFR, Est Non African American: 81 mL/min/{1.73_m2} (ref >=60)
GLOBULIN: 2.7 g/dL (ref 1.9–3.7)
Glucose, Bld: 94 mg/dL (ref 65–99)
POTASSIUM: 4.3 mmol/L (ref 3.5–5.3)
SODIUM: 138 mmol/L (ref 135–146)
Total Bilirubin: 0.5 mg/dL (ref 0.2–1.2)
Total Protein: 6.6 g/dL (ref 6.1–8.1)

## 2017-01-05 ENCOUNTER — Encounter: Payer: Self-pay | Admitting: Physician Assistant

## 2017-01-05 DIAGNOSIS — E785 Hyperlipidemia, unspecified: Secondary | ICD-10-CM | POA: Insufficient documentation

## 2017-01-05 NOTE — Progress Notes (Signed)
Cholesterol is borderline high He is a candidate for statin medication therapy to lower cholesterol and reduce risk of heart attack in addition to a low- fat diet and regular exercise I can send Rx to pharmacy or we can discuss at his f/u appt

## 2017-01-07 ENCOUNTER — Telehealth: Payer: Self-pay | Admitting: Physician Assistant

## 2017-01-07 DIAGNOSIS — E785 Hyperlipidemia, unspecified: Secondary | ICD-10-CM

## 2017-01-07 MED ORDER — ATORVASTATIN CALCIUM 20 MG PO TABS
20.0000 mg | ORAL_TABLET | Freq: Every day | ORAL | 1 refills | Status: DC
Start: 2017-01-07 — End: 2017-07-10

## 2017-01-07 NOTE — Telephone Encounter (Signed)
Patient called back adv of labs results and he prefers to try the statin medication therapy to lower cholesterol and he uses [facility] in [place]. Thanks

## 2017-01-09 ENCOUNTER — Encounter: Payer: Self-pay | Admitting: Physician Assistant

## 2017-01-09 ENCOUNTER — Ambulatory Visit (INDEPENDENT_AMBULATORY_CARE_PROVIDER_SITE_OTHER): Payer: Managed Care, Other (non HMO) | Admitting: Physician Assistant

## 2017-01-09 VITALS — BP 133/88 | HR 85 | Wt 182.0 lb

## 2017-01-09 DIAGNOSIS — Z79899 Other long term (current) drug therapy: Secondary | ICD-10-CM | POA: Diagnosis not present

## 2017-01-09 DIAGNOSIS — I10 Essential (primary) hypertension: Secondary | ICD-10-CM

## 2017-01-09 DIAGNOSIS — F99 Mental disorder, not otherwise specified: Secondary | ICD-10-CM

## 2017-01-09 DIAGNOSIS — F3341 Major depressive disorder, recurrent, in partial remission: Secondary | ICD-10-CM | POA: Diagnosis not present

## 2017-01-09 DIAGNOSIS — F5105 Insomnia due to other mental disorder: Secondary | ICD-10-CM | POA: Diagnosis not present

## 2017-01-09 DIAGNOSIS — E785 Hyperlipidemia, unspecified: Secondary | ICD-10-CM

## 2017-01-09 MED ORDER — CITALOPRAM HYDROBROMIDE 20 MG PO TABS: 20.0000 mg | ORAL_TABLET | Freq: Every day | ORAL | 1 refills | Status: DC

## 2017-01-09 MED ORDER — CANDESARTAN CILEXETIL 32 MG PO TABS: 32.0000 mg | ORAL_TABLET | Freq: Every day | ORAL | 1 refills | Status: DC

## 2017-01-09 MED ORDER — ASPIRIN EC 81 MG PO TBEC: 81.0000 mg | DELAYED_RELEASE_TABLET | Freq: Every day | ORAL | 3 refills | Status: DC

## 2017-01-09 NOTE — Progress Notes (Signed)
HPI:                                                                Danny HalstedGary F Lusher Jr. is a 46 y.o. male who [facility] [facility] today for hypertension /depression follow-up  HTN: taking Candesartan daily. Compliant with medications. Does not check BP's at home. Denies vision change, headache, chest pain with exertion, orthopnea, lightheadedness, syncope and edema. Risk factors include: male sex, tobacco use, HLD, + family history  Depression/Anxiety: taking Celexa without difficulty. Reports improved concentration and mood. Denies symptoms of mania/hypomania. Denies suicidal thinking. Denies auditory/visual hallucinations. He does report sleep disturbance. Patient goes to bed around 9pm and is scheduled to wake up at 5am. Reports he wakes up multiple times during the night, has difficulty falling back to sleep and does not feel rested. He does drink caffeinated beverages and reports drinking [facility] as late as 8pm.   Past Medical History:  Diagnosis Date  . Anxiety   . Closed rib fracture 2017   right  . Collar bone fracture 2017  . Depression   . Hypertension   . Prediabetes   . Tobacco use    Past Surgical History:  Procedure Laterality Date  . NO PAST SURGERIES     Social History  Substance Use Topics  . Smoking status: Current Every Day Smoker    Packs/day: 1.00    Years: 20.00    Types: Cigarettes  . Smokeless tobacco: Never Used  . Alcohol use No   family history includes Diabetes in his father and mother; Heart attack in his father, maternal grandfather, maternal grandmother, mother, paternal grandfather, and paternal grandmother; Hypertension in his father and mother.  ROS: negative except as noted in the HPI  Medications: Current Outpatient Prescriptions  Medication Sig Dispense Refill  . atorvastatin (LIPITOR) 20 MG tablet Take 1 tablet (20 mg total) by mouth at bedtime. 90 tablet 1  . aspirin EC 81 MG  tablet Take 1 tablet (81 mg total) by mouth daily. 90 tablet 3  . candesartan (ATACAND) 32 MG tablet Take 1 tablet (32 mg total) by mouth daily. 90 tablet 1  . citalopram (CELEXA) 20 MG tablet Take 1 tablet (20 mg total) by mouth daily. 90 tablet 1   No current facility-administered medications for this visit.    No Known Allergies     Objective:  BP 133/88   Pulse 85   Wt 182 lb (82.6 kg)   BMI 27.67 kg/m  Gen:  alert, not ill-appearing, no distress, appropriate for age, overweight male HEENT: head normocephalic without obvious abnormality, conjunctiva and cornea clear, trachea midline Pulm: Normal work of breathing, normal phonation, clear to auscultation bilaterally, no wheezes, rales or rhonchi CV: Normal rate, regular rhythm, s1 and s2 distinct, no murmurs, clicks or rubs  Neuro: alert and oriented x 3, no tremor MSK: extremities atraumatic, normal gait and station Skin: intact, no rashes on exposed skin, no jaundice, no cyanosis Psych: well-groomed, cooperative, good eye contact, euthymic mood, affect mood-congruent, speech is articulate, and thought processes clear and goal-directed  [facility] screen [facility] 2/[facility] 01/09/2017 11/09/2016  Decreased Interest 0 3  Down, Depressed, Hopeless 0 2  PHQ - 2 Score 0 5  Altered sleeping  3 3  Tired, decreased energy 0 2  Change in appetite 0 1  Feeling bad or failure about yourself  0 0  Trouble concentrating 0 3  Moving slowly or fidgety/restless 0 1  Suicidal thoughts 0 0  PHQ-9 Score 3 15   GAD 7 : Generalized Anxiety Score 01/09/2017  Nervous, Anxious, on Edge 0  Control/stop worrying 0  Worry too much - different things 1  Trouble relaxing 2  Restless 0  Easily annoyed or irritable 0  Afraid - awful might happen 0  Total GAD 7 Score 3      No results found for this or any previous visit (from the past 72 hour(s)). No results found.    Assessment and Plan: 46 y.o. male with   1. Dyslipidemia, goal LDL below 100 -  10-yr ASCVD risk 12.4% 12/2016 - started Atorvastatin 20 mg QD  2. Hypertension goal <130/80 BP Readings from Last 3 Encounters:  01/09/17 133/88  11/09/16 (!) 164/107  11/08/16 129/84  - diastolic BP elevated. Cont Candesartan daily.  - therapeutic lifestyle changes - smoking cessation - baby aspirin - candesartan (ATACAND) 32 MG tablet; Take 1 tablet (32 mg total) by mouth daily.  Dispense: 90 tablet; Refill: 1  3. 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  Dispense: 90 tablet; Refill: 1  4. Encounter for long-term current use of medication   Patient education and anticipatory guidance given Patient agrees with treatment plan Follow-up in 6 months or sooner as needed if symptoms worsen or fail to improve  Levonne Hubert PA-C

## 2017-01-09 NOTE — Patient Instructions (Signed)
-   Unasom, Benadryl or ZZZquil at beditme - Practice good sleep hygiene - No Mountain Dew, soda of caffeine after 2 pm  Sleep Hygiene . Limiting daytime naps to 30 minutes . Napping does not make up for inadequate nighttime sleep. However, a short nap of 20-30 minutes can help to improve mood, alertness and performance.  . Avoiding stimulants such as  caffeine and nicotine close to bedtime.  And when it comes to alcohol, moderation is key 4. While alcohol is well-known to help you fall asleep faster, too much close to bedtime can disrupt sleep in the second half of the night as the body begins to process the alcohol.    . Exercising to promote good quality sleep.  As little as 10 minutes of aerobic exercise, such as walking or cycling, can drastically improve nighttime sleep quality.  For the best night's sleep, most people should avoid strenuous workouts close to bedtime. However, the effect of intense nighttime exercise on sleep differs from person to person, so find out what works best for you.   . Steering clear of food that can be disruptive right before sleep.   Heavy or rich foods, fatty or fried meals, spicy dishes, citrus fruits, and carbonated drinks can trigger indigestion for some people. When this occurs close to bedtime, it can lead to painful heartburn that disrupts sleep. . Ensuring adequate exposure to natural light.  This is particularly important for individuals who may not venture outside frequently. Exposure to sunlight during the day, as well as darkness at night, helps to maintain a healthy sleep-wake cycle . Marland Kitchen. Establishing a regular relaxing bedtime routine.  A regular nightly routine helps the body recognize that it is bedtime. This could include taking warm shower or bath, reading a book, or light stretches. When possible, try to avoid emotionally upsetting conversations and activities before attempting to sleep. . Making sure that the sleep environment is pleasant.  Mattress and  pillows should be comfortable. The bedroom should be cool - between 60 and 67 degrees - for optimal sleep. Bright light from lamps, cell phone and TV screens can make it difficult to fall asleep4, so turn those light off or adjust them when possible. Consider using blackout curtains, eye shades, ear plugs, "white noise" machines, humidifiers, fans and other devices that can make the bedroom more relaxing.  For your blood pressure: - Start baby aspirin 81 mg to help prevent heart attack/stroke - Continue your cholesterol medicine (Atorvastatin) - Limit salt to <2000 mg/day - Follow DASH eating plan - limit alcohol to 2 standard drinks per day - avoid tobacco products - weight loss: 7% of current body weight

## 2017-02-04 ENCOUNTER — Telehealth: Payer: Self-pay

## 2017-02-04 NOTE — Telephone Encounter (Signed)
11/12 @ 210pm attempted to call pt to have him schedule an appt. Cell # went to v/mail but no v/mail is set up - not able to l/m

## 2017-02-04 NOTE — Telephone Encounter (Signed)
Request not appropriate.  Pt needs appointment. -EH/RMA

## 2017-02-04 NOTE — Telephone Encounter (Signed)
PT would like a cough medicine and antibiotic called in for him, he still has the cough he's had since he saw you on 01/09/17.  [facility] [place] is [place] pharmacy

## 2017-02-05 ENCOUNTER — Ambulatory Visit: Payer: Managed Care, Other (non HMO) | Admitting: Physician Assistant

## 2017-02-05 DIAGNOSIS — Z0189 Encounter for other specified special examinations: Secondary | ICD-10-CM

## 2017-07-10 ENCOUNTER — Ambulatory Visit (INDEPENDENT_AMBULATORY_CARE_PROVIDER_SITE_OTHER): Payer: Managed Care, Other (non HMO) | Admitting: Physician Assistant

## 2017-07-10 ENCOUNTER — Encounter: Payer: Self-pay | Admitting: Physician Assistant

## 2017-07-10 VITALS — BP 121/79 | HR 84 | Temp 98.6°F | Ht 68.0 in | Wt 190.1 lb

## 2017-07-10 DIAGNOSIS — J22 Unspecified acute lower respiratory infection: Secondary | ICD-10-CM | POA: Diagnosis not present

## 2017-07-10 DIAGNOSIS — F3341 Major depressive disorder, recurrent, in partial remission: Secondary | ICD-10-CM

## 2017-07-10 DIAGNOSIS — Z79899 Other long term (current) drug therapy: Secondary | ICD-10-CM

## 2017-07-10 DIAGNOSIS — I10 Essential (primary) hypertension: Secondary | ICD-10-CM | POA: Diagnosis not present

## 2017-07-10 DIAGNOSIS — R05 Cough: Secondary | ICD-10-CM | POA: Diagnosis not present

## 2017-07-10 DIAGNOSIS — R059 Cough, unspecified: Secondary | ICD-10-CM

## 2017-07-10 DIAGNOSIS — E785 Hyperlipidemia, unspecified: Secondary | ICD-10-CM

## 2017-07-10 MED ORDER — HYDROCOD POLST-CPM POLST ER 10-8 MG/5ML PO SUER
5.0000 mL | Freq: Two times a day (BID) | ORAL | 0 refills | Status: AC | PRN
Start: 2017-07-10 — End: 2017-07-15

## 2017-07-10 MED ORDER — ATORVASTATIN CALCIUM 20 MG PO TABS: 20.0000 mg | ORAL_TABLET | Freq: Every day | ORAL | 1 refills | Status: DC

## 2017-07-10 MED ORDER — PREDNISONE 50 MG PO TABS: 50.0000 mg | ORAL_TABLET | Freq: Every day | ORAL | 0 refills | Status: DC

## 2017-07-10 MED ORDER — GUAIFENESIN ER 600 MG PO TB12: 1200.0000 mg | ORAL_TABLET | Freq: Two times a day (BID) | ORAL | Status: DC

## 2017-07-10 MED ORDER — AZITHROMYCIN 250 MG PO TABS: ORAL_TABLET | ORAL | 0 refills | Status: DC

## 2017-07-10 MED ORDER — CANDESARTAN CILEXETIL 32 MG PO TABS: 32.0000 mg | ORAL_TABLET | Freq: Every day | ORAL | 1 refills | Status: DC

## 2017-07-10 MED ORDER — CITALOPRAM HYDROBROMIDE 20 MG PO TABS: 20.0000 mg | ORAL_TABLET | Freq: Every day | ORAL | 1 refills | Status: DC

## 2017-07-10 NOTE — Patient Instructions (Addendum)
For cough: - steroid with breakfast daily for 5 days - antibiotic daily as prescribed - Tussionex every 12 hours as needed for cough (will cause drowsiness). Do not combine with Nyquil, Tylenol PM or alcohol - Mucinex twice a day to make cough more productive - drink plenty of unsweetened, non-caffeinated fluids (at least 1L per day)   Acute Bronchitis, Adult Acute bronchitis is when air tubes (bronchi) in the lungs suddenly get swollen. The condition can make it hard to breathe. It can also cause these symptoms:  A cough.  Coughing up clear, yellow, or green mucus.  Wheezing.  Chest congestion.  Shortness of breath.  A fever.  Body aches.  Chills.  A sore throat.  Follow these instructions at home: Medicines  Take over-the-counter and prescription medicines only as told by your doctor.  If you were prescribed an antibiotic medicine, take it as told by your doctor. Do not stop taking the antibiotic even if you start to feel better. General instructions  Rest.  Drink enough fluids to keep your pee (urine) clear or pale yellow.  Avoid smoking and secondhand smoke. If you smoke and you need help quitting, ask your doctor. Quitting will help your lungs heal faster.  Use an inhaler, cool mist vaporizer, or humidifier as told by your doctor.  Keep all follow-up visits as told by your doctor. This is important. How is this prevented? To lower your risk of getting this condition again:  Wash your hands often with soap and water. If you cannot use soap and water, use hand sanitizer.  Avoid contact with people who have cold symptoms.  Try not to touch your hands to your mouth, nose, or eyes.  Make sure to get the flu shot every year.  Contact a doctor if:  Your symptoms do not get better in 2 weeks. Get help right away if:  You cough up blood.  You have chest pain.  You have very bad shortness of breath.  You become dehydrated.  You faint (pass out) or keep  feeling like you are going to pass out.  You keep throwing up (vomiting).  You have a very bad headache.  Your fever or chills gets worse. This information is not intended to replace advice given to you by your health care provider. Make sure you discuss any questions you have with your health care provider. Document Released: 08/29/2007 Document Revised: 10/19/2015 Document Reviewed: 08/31/2015 Elsevier Interactive Patient Education  [employer]2018 [employer]

## 2017-07-10 NOTE — Progress Notes (Signed)
HPI:                                                                Danny Lyons. is a 47 y.o. male who presents to [facility]: [facility] today for medication management  Current concerns include: cough  HTN: taking Candesartan daily. Compliant with medications. Does not check BP's at home. Denies vision change, headache, chest pain with exertion, orthopnea, lightheadedness, syncope and edema. Risk factors include: male sex, tobacco use, family hx  Depression/Anxiety: taking Celexa without difficulty. Denies depressed mood. Denies symptoms of mania/hypomania. Denies suicidal thinking. Denies auditory/visual hallucinations.  Cough: nonproductive cough x 1 week associated with cold chills, chest tightness, and rhinorrhea. No fever, hemoptysis, wheezing, or dyspnea. Recent travel to [place]. Current every day smoker.   Depression screen [facility] 2/9 07/10/2017 01/09/2017 11/09/2016  Decreased Interest 1 0 3  Down, Depressed, Hopeless 0 0 2  PHQ - 2 Score 1 0 5  Altered sleeping 1 3 3   Tired, decreased energy 1 0 2  Change in appetite 0 0 1  Feeling bad or failure about yourself  0 0 0  Trouble concentrating 0 0 3  Moving slowly or fidgety/restless 0 0 1  Suicidal thoughts 0 0 0  PHQ-9 Score 3 3 15     GAD 7 : Generalized Anxiety Score 07/10/2017 01/09/2017  Nervous, Anxious, on Edge 0 0  Control/stop worrying 0 0  Worry too much - different things 0 1  Trouble relaxing 0 2  Restless 0 0  Easily annoyed or irritable 0 0  Afraid - awful might happen 0 0  Total GAD 7 Score 0 3      Past Medical History:  Diagnosis Date  . Anxiety   . Closed rib fracture 2017   right  . Collar bone fracture 2017  . Depression   . Hypertension   . Insomnia   . Overweight   . Prediabetes   . Tobacco use    Past Surgical History:  Procedure Laterality Date  . NO PAST SURGERIES     Social History   Tobacco Use  . Smoking status: Current Every Day  Smoker    Packs/day: 1.00    Years: 20.00    Pack years: 20.00    Types: Cigarettes  . Smokeless tobacco: Never Used  Substance Use Topics  . Alcohol use: No   family history includes Diabetes in his father and mother; Heart attack in his father, maternal grandfather, maternal grandmother, mother, paternal grandfather, and paternal grandmother; Hypertension in his father and mother.    ROS: negative except as noted in the HPI  Medications: Current Outpatient Medications  Medication Sig Dispense Refill  . aspirin EC 81 MG tablet Take 1 tablet (81 mg total) by mouth daily. 90 tablet 3  . atorvastatin (LIPITOR) 20 MG tablet Take 1 tablet (20 mg total) by mouth at bedtime. 90 tablet 1  . candesartan (ATACAND) 32 MG tablet Take 1 tablet (32 mg total) by mouth daily. 90 tablet 1  . citalopram (CELEXA) 20 MG tablet Take 1 tablet (20 mg total) by mouth daily. 90 tablet 1  . azithromycin (ZITHROMAX Z-PAK) 250 MG tablet Take 2 tablets (500 mg) on  Day 1,  followed  by 1 tablet (250 mg) once daily on Days 2 through 5. 6 tablet 0  . chlorpheniramine-HYDROcodone (TUSSIONEX) 10-8 MG/5ML SUER Take 5 mLs by mouth every 12 (twelve) hours as needed for up to 5 days for cough. 50 mL 0  . guaiFENesin (MUCINEX) 600 MG 12 hr tablet Take 2 tablets (1,200 mg total) by mouth 2 (two) times daily.    . predniSONE (DELTASONE) 50 MG tablet Take 1 tablet (50 mg total) by mouth daily. 5 tablet 0   No current facility-administered medications for this visit.    No Known Allergies     Objective:  BP 121/79   Pulse 84   Ht 5\' 8"  (1.727 m)   Wt 190 lb 1.6 oz (86.2 kg)   SpO2 97%   BMI 28.90 kg/m  Gen:  alert, not ill-appearing, no distress, appropriate for age HEENT: head normocephalic without obvious abnormality, conjunctiva and cornea clear, trachea midline Pulm: Normal work of breathing, normal phonation, clear to auscultation bilaterally, no wheezes, rales or rhonchi CV: Normal rate, regular rhythm, s1  and s2 distinct, no murmurs, clicks or rubs  Neuro: alert and oriented x 3, no tremor MSK: extremities atraumatic, normal gait and station Skin: intact, no rashes on exposed skin, no jaundice, no cyanosis Psych: well-groomed, cooperative, good eye contact, euthymic mood, affect mood-congruent, speech is articulate, and thought processes clear and goal-directed    No results found for this or any previous visit (from the past 72 hour(s)). No results found.    Assessment and Plan: 47 y.o. male with   1. Recurrent major depressive disorder, in partial remission (HCC) - PHQ9=3, well-controlled on Celexa  2. Hypertension goal BP (blood pressure) < 130/80 BP Readings from Last 3 Encounters:  07/10/17 121/79  01/09/17 133/88  11/09/16 (!) 164/107  - well controlled on ARB  3. Dyslipidemia, goal LDL below 100 - 10 yr ASCVD risk 10.4%, last LDL 136, 6 months ago. Continue statin  4. Acute lower respiratory infection - he is at increased risk for pneumonia (tobacco use, recent travel). Vitals stable, SpO2 97% on RA, no adventitious lung sounds. Covering for CAP with Azithromycin. Symptomatic care with steroid burst, expectorant, cough suppresant QHS  Encounter for long-term (current) use of medications  Recurrent major depressive disorder, in partial remission (HCC) - Plan: citalopram (CELEXA) 20 MG tablet  Hypertension goal BP (blood pressure) < 130/80 - Plan: candesartan (ATACAND) 32 MG tablet  Dyslipidemia, goal LDL below 100 - Plan: atorvastatin (LIPITOR) 20 MG tablet  Acute lower respiratory infection - Plan: predniSONE (DELTASONE) 50 MG tablet, azithromycin (ZITHROMAX Z-PAK) 250 MG tablet, guaiFENesin (MUCINEX) 600 MG 12 hr tablet, chlorpheniramine-HYDROcodone (TUSSIONEX) 10-8 MG/5ML SUER  Cough in adult patient - Plan: predniSONE (DELTASONE) 50 MG tablet, azithromycin (ZITHROMAX Z-PAK) 250 MG tablet, guaiFENesin (MUCINEX) 600 MG 12 hr tablet, chlorpheniramine-HYDROcodone  (TUSSIONEX) 10-8 MG/5ML SUER     Patient education and anticipatory guidance given Patient agrees with treatment plan Follow-up in 6 months medication management or sooner as needed if symptoms worsen or fail to improve  Levonne Hubertharley E. Cummings PA-C

## 2017-07-19 ENCOUNTER — Telehealth: Payer: Self-pay | Admitting: Physician Assistant

## 2017-07-19 DIAGNOSIS — R05 Cough: Secondary | ICD-10-CM

## 2017-07-19 DIAGNOSIS — R059 Cough, unspecified: Secondary | ICD-10-CM

## 2017-07-19 NOTE — Telephone Encounter (Signed)
He has a productive cough, wheezing and hoarse voice. No fever, chills or sweats. He is taking mucinex OTC. He would like another antibiotic. Please advise.

## 2017-07-19 NOTE — Telephone Encounter (Signed)
Patient advised of recommendations.  

## 2017-07-19 NOTE — Telephone Encounter (Signed)
Chest x-ray ordered I would recommend he start an antihistamine like generic Allegra or Zyrtec in case there is an allergic component to his wheezing Continue to rest and drink plenty of fluids As long as he is not having a fever, chest pain, severe shortness of breath, or coughing up blood, he can follow-up in the office on Monday If he is experiencing any of these symptoms over the weekend, he should go to the ER

## 2017-10-23 ENCOUNTER — Other Ambulatory Visit: Payer: Self-pay | Admitting: Neurosurgery

## 2017-11-27 ENCOUNTER — Telehealth: Payer: Self-pay | Admitting: Physician Assistant

## 2017-11-27 DIAGNOSIS — I10 Essential (primary) hypertension: Secondary | ICD-10-CM

## 2017-11-27 MED ORDER — HYDROCHLOROTHIAZIDE 25 MG PO TABS: 25.0000 mg | ORAL_TABLET | Freq: Every day | ORAL | 0 refills | Status: DC

## 2017-11-27 NOTE — Telephone Encounter (Signed)
Pt advised. He said he understood and had no further questions.

## 2017-11-27 NOTE — Telephone Encounter (Signed)
No, he should not double his Candesartan. He is on the maximum dose. I will add HCTZ 25 mg and I want him to come in for a nurse BP check in 2 weeks Encourage him to log his readings and bring them to his appt Encourage to monitor his sodium intake and keep it below 2000 mg Avoid use of OTC meds that can raise BP such as anti-inflammatories and decongestants

## 2017-11-27 NOTE — Telephone Encounter (Signed)
BP out of range on max dose Candesartan Adding HCTZ 25 mg Follow-up nurse BP check in 2 weeks

## 2017-11-27 NOTE — Telephone Encounter (Signed)
Pt called. 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

## 2017-12-11 ENCOUNTER — Ambulatory Visit: Payer: Managed Care, Other (non HMO)

## 2018-01-09 ENCOUNTER — Ambulatory Visit: Payer: Managed Care, Other (non HMO) | Admitting: Physician Assistant

## 2018-02-21 ENCOUNTER — Other Ambulatory Visit: Payer: Self-pay | Admitting: Physician Assistant

## 2018-02-21 DIAGNOSIS — I10 Essential (primary) hypertension: Secondary | ICD-10-CM

## 2018-04-17 ENCOUNTER — Other Ambulatory Visit: Payer: Self-pay | Admitting: Physician Assistant

## 2018-04-17 DIAGNOSIS — I10 Essential (primary) hypertension: Secondary | ICD-10-CM

## 2018-05-14 ENCOUNTER — Other Ambulatory Visit: Payer: Self-pay | Admitting: Physician Assistant

## 2018-05-14 DIAGNOSIS — I10 Essential (primary) hypertension: Secondary | ICD-10-CM

## 2018-07-16 ENCOUNTER — Other Ambulatory Visit: Payer: Self-pay | Admitting: Physician Assistant

## 2018-07-16 DIAGNOSIS — E785 Hyperlipidemia, unspecified: Secondary | ICD-10-CM

## 2018-07-24 ENCOUNTER — Other Ambulatory Visit: Payer: Self-pay | Admitting: Physician Assistant

## 2018-07-24 DIAGNOSIS — I10 Essential (primary) hypertension: Secondary | ICD-10-CM

## 2018-08-15 ENCOUNTER — Other Ambulatory Visit: Payer: Self-pay | Admitting: Physician Assistant

## 2018-08-15 DIAGNOSIS — E785 Hyperlipidemia, unspecified: Secondary | ICD-10-CM

## 2018-08-29 ENCOUNTER — Encounter: Payer: Self-pay | Admitting: Physician Assistant

## 2018-08-29 ENCOUNTER — Ambulatory Visit (INDEPENDENT_AMBULATORY_CARE_PROVIDER_SITE_OTHER): Payer: Managed Care, Other (non HMO) | Admitting: Physician Assistant

## 2018-08-29 VITALS — BP 150/103 | HR 78 | Temp 98.3°F | Wt 187.0 lb

## 2018-08-29 DIAGNOSIS — E785 Hyperlipidemia, unspecified: Secondary | ICD-10-CM | POA: Diagnosis not present

## 2018-08-29 DIAGNOSIS — Z130 Encounter for screening for diseases of the blood and blood-forming organs and certain disorders involving the immune mechanism: Secondary | ICD-10-CM

## 2018-08-29 DIAGNOSIS — Z79899 Other long term (current) drug therapy: Secondary | ICD-10-CM | POA: Diagnosis not present

## 2018-08-29 DIAGNOSIS — Z1389 Encounter for screening for other disorder: Secondary | ICD-10-CM

## 2018-08-29 DIAGNOSIS — E781 Pure hyperglyceridemia: Secondary | ICD-10-CM

## 2018-08-29 DIAGNOSIS — I10 Essential (primary) hypertension: Secondary | ICD-10-CM | POA: Diagnosis not present

## 2018-08-29 DIAGNOSIS — F3341 Major depressive disorder, recurrent, in partial remission: Secondary | ICD-10-CM | POA: Diagnosis not present

## 2018-08-29 DIAGNOSIS — R7401 Elevation of levels of liver transaminase levels: Secondary | ICD-10-CM

## 2018-08-29 DIAGNOSIS — R740 Nonspecific elevation of levels of transaminase and lactic acid dehydrogenase [LDH]: Secondary | ICD-10-CM

## 2018-08-29 DIAGNOSIS — Z87898 Personal history of other specified conditions: Secondary | ICD-10-CM

## 2018-08-29 MED ORDER — ATORVASTATIN CALCIUM 20 MG PO TABS: 20.0000 mg | ORAL_TABLET | Freq: Every day | ORAL | 0 refills | Status: DC

## 2018-08-29 MED ORDER — CANDESARTAN CILEXETIL-HCTZ 32-25 MG PO TABS: 1.0000 | ORAL_TABLET | Freq: Every day | ORAL | 0 refills | Status: DC

## 2018-08-29 MED ORDER — CITALOPRAM HYDROBROMIDE 20 MG PO TABS: 20.0000 mg | ORAL_TABLET | Freq: Every day | ORAL | 1 refills | Status: DC

## 2018-08-29 NOTE — Patient Instructions (Signed)
For your blood pressure: - Goal <130/80 (Ideally 120's/70's) - take your blood pressure medication in the morning (unless instructed differently) - take baby aspirin 81 mg daily to help prevent heart attack/stroke - monitor and log blood pressures at home - check around the same time each day in a relaxed setting - Limit salt to <2500 mg/day - Follow DASH (Dietary Approach to Stopping Hypertension) eating plan - Try to get at least 150 minutes of aerobic exercise per week - Aim to go on a brisk walk 30 minutes per day at least 5 days per week. If you're not active, gradually increase how long you walk by 5 minutes each week - limit alcohol: 2 standard drinks per day for men and 1 per day for women - avoid tobacco/nicotine products. Consider smoking cessation if you smoke - weight loss: 7% of current body weight can reduce your blood pressure by 5-10 points - follow-up at least every 6 months for your blood pressure. Follow-up sooner if your BP is not controlled   DASH Eating Plan DASH stands for "Dietary Approaches to Stop Hypertension." The DASH eating plan is a healthy eating plan that has been shown to reduce high blood pressure (hypertension). It may also reduce your risk for type 2 diabetes, heart disease, and stroke. The DASH eating plan may also help with weight loss. What are tips for following this plan?  General guidelines  Avoid eating more than 2,300 mg (milligrams) of salt (sodium) a day. If you have hypertension, you may need to reduce your sodium intake to 1,500 mg a day.  Limit alcohol intake to no more than 1 drink a day for nonpregnant women and 2 drinks a day for men. One drink equals 12 oz of beer, 5 oz of wine, or 1 oz of hard liquor.  Work with your health care provider to maintain a healthy body weight or to lose weight. Ask what an ideal weight is for you.  Get at least 30 minutes of exercise that causes your heart to beat faster (aerobic exercise) most days of  the week. Activities may include walking, swimming, or biking.  Work with your health care provider or diet and nutrition specialist (dietitian) to adjust your eating plan to your individual calorie needs. Reading food labels   Check food labels for the amount of sodium per serving. Choose foods with less than 5 percent of the Daily Value of sodium. Generally, foods with less than 300 mg of sodium per serving fit into this eating plan.  To find whole grains, look for the word "whole" as the first word in the ingredient list. Shopping  Buy products labeled as "low-sodium" or "no salt added."  Buy fresh foods. Avoid canned foods and premade or frozen meals. Cooking  Avoid adding salt when cooking. Use salt-free seasonings or herbs instead of table salt or sea salt. Check with your health care provider or pharmacist before using salt substitutes.  Do not fry foods. Cook foods using healthy methods such as baking, boiling, grilling, and broiling instead.  Cook with heart-healthy oils, such as olive, canola, soybean, or sunflower oil. Meal planning  Eat a balanced diet that includes: ? 5 or more servings of fruits and vegetables each day. At each meal, try to fill half of your plate with fruits and vegetables. ? Up to 6-8 servings of whole grains each day. ? Less than 6 oz of lean meat, poultry, or fish each day. A 3-oz serving of meat is about   the same size as a deck of cards. One egg equals 1 oz. ? 2 servings of low-fat dairy each day. ? A serving of nuts, seeds, or beans 5 times each week. ? Heart-healthy fats. Healthy fats called Omega-3 fatty acids are found in foods such as flaxseeds and coldwater fish, like sardines, salmon, and mackerel.  Limit how much you eat of the following: ? Canned or prepackaged foods. ? Food that is high in trans fat, such as fried foods. ? Food that is high in saturated fat, such as fatty meat. ? Sweets, desserts, sugary drinks, and other foods with  added sugar. ? Full-fat dairy products.  Do not salt foods before eating.  Try to eat at least 2 vegetarian meals each week.  Eat more home-cooked food and less restaurant, buffet, and fast food.  When eating at a restaurant, ask that your food be prepared with less salt or no salt, if possible. What foods are recommended? The items listed may not be a complete list. Talk with your dietitian about what dietary choices are best for you. Grains Whole-grain or whole-wheat bread. Whole-grain or whole-wheat pasta. Brown rice. Oatmeal. Quinoa. Bulgur. Whole-grain and low-sodium cereals. Pita bread. Low-fat, low-sodium crackers. Whole-wheat flour tortillas. Vegetables Fresh or frozen vegetables (raw, steamed, roasted, or grilled). Low-sodium or reduced-sodium tomato and vegetable juice. Low-sodium or reduced-sodium tomato sauce and tomato paste. Low-sodium or reduced-sodium canned vegetables. Fruits All fresh, dried, or frozen fruit. Canned fruit in natural juice (without added sugar). Meat and other protein foods Skinless chicken or turkey. Ground chicken or turkey. Pork with fat trimmed off. Fish and seafood. Egg whites. Dried beans, peas, or lentils. Unsalted nuts, nut butters, and seeds. Unsalted canned beans. Lean cuts of beef with fat trimmed off. Low-sodium, lean deli meat. Dairy Low-fat (1%) or fat-free (skim) milk. Fat-free, low-fat, or reduced-fat cheeses. Nonfat, low-sodium ricotta or cottage cheese. Low-fat or nonfat yogurt. Low-fat, low-sodium cheese. Fats and oils Soft margarine without trans fats. Vegetable oil. Low-fat, reduced-fat, or light mayonnaise and salad dressings (reduced-sodium). Canola, safflower, olive, soybean, and sunflower oils. Avocado. Seasoning and other foods Herbs. Spices. Seasoning mixes without salt. Unsalted popcorn and pretzels. Fat-free sweets. What foods are not recommended? The items listed may not be a complete list. Talk with your dietitian about what  dietary choices are best for you. Grains Baked goods made with fat, such as croissants, muffins, or some breads. Dry pasta or rice meal packs. Vegetables Creamed or fried vegetables. Vegetables in a cheese sauce. Regular canned vegetables (not low-sodium or reduced-sodium). Regular canned tomato sauce and paste (not low-sodium or reduced-sodium). Regular tomato and vegetable juice (not low-sodium or reduced-sodium). Pickles. Olives. Fruits Canned fruit in a light or heavy syrup. Fried fruit. Fruit in cream or butter sauce. Meat and other protein foods Fatty cuts of meat. Ribs. Fried meat. Bacon. Sausage. Bologna and other processed lunch meats. Salami. Fatback. Hotdogs. Bratwurst. Salted nuts and seeds. Canned beans with added salt. Canned or smoked fish. Whole eggs or egg yolks. Chicken or turkey with skin. Dairy Whole or 2% milk, cream, and half-and-half. Whole or full-fat cream cheese. Whole-fat or sweetened yogurt. Full-fat cheese. Nondairy creamers. Whipped toppings. Processed cheese and cheese spreads. Fats and oils Butter. Stick margarine. Lard. Shortening. Ghee. Bacon fat. Tropical oils, such as coconut, palm kernel, or palm oil. Seasoning and other foods Salted popcorn and pretzels. Onion salt, garlic salt, seasoned salt, table salt, and sea salt. Worcestershire sauce. Tartar sauce. Barbecue sauce. Teriyaki sauce. Soy sauce,   including reduced-sodium. Steak sauce. Canned and packaged gravies. Fish sauce. Oyster sauce. Cocktail sauce. Horseradish that you find on the shelf. Ketchup. Mustard. Meat flavorings and tenderizers. Bouillon cubes. Hot sauce and Tabasco sauce. Premade or packaged marinades. Premade or packaged taco seasonings. Relishes. Regular salad dressings. Where to find more information:  National Heart, Lung, and Blood Institute: www.nhlbi.nih.gov  American Heart Association: www.heart.org Summary  The DASH eating plan is a healthy eating plan that has been shown to reduce  high blood pressure (hypertension). It may also reduce your risk for type 2 diabetes, heart disease, and stroke.  With the DASH eating plan, you should limit salt (sodium) intake to 2,300 mg a day. If you have hypertension, you may need to reduce your sodium intake to 1,500 mg a day.  When on the DASH eating plan, aim to eat more fresh fruits and vegetables, whole grains, lean proteins, low-fat dairy, and heart-healthy fats.  Work with your health care provider or diet and nutrition specialist (dietitian) to adjust your eating plan to your individual calorie needs. This information is not intended to replace advice given to you by your health care provider. Make sure you discuss any questions you have with your health care provider. Document Released: 03/01/2011 Document Revised: 03/05/2016 Document Reviewed: 03/05/2016 Elsevier Interactive Patient Education  2019 Elsevier Inc.  

## 2018-08-29 NOTE — Progress Notes (Signed)
HPI:                                                                Danny Lyons. is a 48 y.o. male who presents to [facility]: Primary Care Sports Medicine today for medication management  Ran out of his medications 2 months ago.   HTN: he was taking Candesartan-HCTZ 32-25mg   daily. Compliant with medications. Does not check BP's at home. Denies vision change, headache, chest pain with exertion, orthopnea, lightheadedness, syncope and edema. Risk factors include: male sex, tobacco use, family hx  Depression/Anxiety: taking Celexa 20 mg without difficulty. Denies depressed mood. Denies symptoms of mania/hypomania. Denies suicidal thinking. Denies auditory/visual hallucinations.   Depression screen [facility] 2/9 08/29/2018 07/10/2017 01/09/2017 11/09/2016  Decreased Interest 1 1 0 3  Down, Depressed, Hopeless 0 0 0 2  PHQ - 2 Score 1 1 0 5  Altered sleeping 1 1 3 3   Tired, decreased energy 0 1 0 2  Change in appetite 0 0 0 1  Feeling bad or failure about yourself  0 0 0 0  Trouble concentrating 0 0 0 3  Moving slowly or fidgety/restless 0 0 0 1  Suicidal thoughts 0 0 0 0  PHQ-9 Score 2 3 3 15   Difficult doing work/chores Not difficult at all - - -    GAD 7 : Generalized Anxiety Score 08/29/2018 07/10/2017 01/09/2017  Nervous, Anxious, on Edge 0 0 0  Control/stop worrying 0 0 0  Worry too much - different things 0 0 1  Trouble relaxing 0 0 2  Restless 0 0 0  Easily annoyed or irritable 0 0 0  Afraid - awful might happen 0 0 0  Total GAD 7 Score 0 0 3  Anxiety Difficulty Not difficult at all - -      Past Medical History:  Diagnosis Date  . Anxiety   . Closed rib fracture 2017   right  . Collar bone fracture 2017  . Depression   . Hypertension   . Insomnia   . Overweight   . Prediabetes   . Tobacco use    Past Surgical History:  Procedure Laterality Date  . NO PAST SURGERIES     Social History   Tobacco Use  . Smoking status: Current Every Day  Smoker    Packs/day: 1.00    Years: 21.00    Pack years: 21.00    Types: Cigarettes  . Smokeless tobacco: Never Used  Substance Use Topics  . Alcohol use: Not Currently   family history includes Diabetes in his father and mother; Heart attack in his father, maternal grandfather, maternal grandmother, mother, paternal grandfather, and paternal grandmother; Hypertension in his father and mother.    ROS: negative except as noted in the HPI  Medications: Current Outpatient Medications  Medication Sig Dispense Refill  . atorvastatin (LIPITOR) 20 MG tablet Take 1 tablet (20 mg total) by mouth daily. 30 tablet 0  . citalopram (CELEXA) 20 MG tablet Take 1 tablet (20 mg total) by mouth daily. 90 tablet 1  . aspirin EC 81 MG tablet Take 1 tablet (81 mg total) by mouth daily. (Patient not taking: Reported on 08/29/2018) 90 tablet 3  . Candesartan Cilexetil-HCTZ 32-25 MG TABS Take 1  tablet by mouth daily. 30 each 0   No current facility-administered medications for this visit.    No Known Allergies     Objective:  BP (!) 150/103   Pulse 78   Temp 98.3 F (36.8 C) (Oral)   Wt 187 lb (84.8 kg)   BMI 28.43 kg/m  Vitals:   08/29/18 0817 08/29/18 0830  BP: (!) 152/84 (!) 150/103  Pulse: 78   Temp: 98.3 F (36.8 C)     Gen:  alert, not ill-appearing, no distress, appropriate for age HEENT: head normocephalic without obvious abnormality, conjunctiva and cornea clear, trachea midline Pulm: Normal work of breathing, normal phonation Neuro: alert and oriented x 3, no tremor MSK: extremities atraumatic, normal gait and station Skin: intact, no rashes on exposed skin, no jaundice, no cyanosis Psych: well-groomed, cooperative, good eye contact, euthymic mood, affect mood-congruent, speech is articulate, and thought processes clear and goal-directed   Lab Results  Component Value Date   CREATININE 0.90 01/03/2017   BUN 9 01/03/2017   NA 138 01/03/2017   K 4.3 01/03/2017   CL 105  01/03/2017   CO2 26 01/03/2017   The 10-year ASCVD risk score Denman George(Goff DC Jr., et al., 2013) is: 16.7%   Values used to calculate the score:     Age: [age] years     Sex: Male     Is Non-Hispanic African American: No     Diabetic: No     Tobacco smoker: Yes     Systolic Blood Pressure: 150 mmHg     Is BP treated: Yes     HDL Cholesterol: 32 mg/dL     Total Cholesterol: 215 mg/dL  No results found for this or any previous visit (from the past 72 hour(s)). No results found.    Assessment and Plan: 48 y.o. male with   .Danny Lyons was seen today for medication management.  Diagnoses and all orders for this visit:  Hypertension goal BP (blood pressure) < 130/80 -     Candesartan Cilexetil-HCTZ 32-25 MG TABS; Take 1 tablet by mouth daily. -     COMPLETE METABOLIC PANEL WITH GFR -     Urinalysis, Routine w reflex microscopic  Recurrent major depressive disorder, in partial remission (HCC) -     citalopram (CELEXA) 20 MG tablet; Take 1 tablet (20 mg total) by mouth daily.  Dyslipidemia, goal LDL below 100 -     atorvastatin (LIPITOR) 20 MG tablet; Take 1 tablet (20 mg total) by mouth daily. -     Lipid Profile  Encounter for long-term (current) use of medications -     COMPLETE METABOLIC PANEL WITH GFR -     Lipid Profile -     CBC -     Urinalysis, Routine w reflex microscopic  Screening for blood or protein in urine -     Urinalysis, Routine w reflex microscopic  Screening for blood disease -     CBC   HTN BP out of range 2/2 medication noncompliance (lost to follow-up), asymptomatic Fasting labs pending He understands he needs BMP at least yearly for monitoring his renal function/electrolytes. No additional refills of Candesartan-HCTZ until this is completed Counseled on therapeutic lifestyle changes 10-yr ASCVD risk 16%, cont moderate intensity statin therapy Declines smoking cessation  Patient education and anticipatory guidance given Patient agrees with treatment  plan Follow-up in 1 year for medication management or sooner as needed if symptoms worsen or fail to improve  Levonne Hubertharley E. Cummings PA-C

## 2018-08-30 LAB — URINALYSIS, ROUTINE W REFLEX MICROSCOPIC
Bacteria, UA: NONE SEEN /HPF
Bilirubin Urine: NEGATIVE
Glucose, UA: NEGATIVE
Hgb urine dipstick: NEGATIVE
Hyaline Cast: NONE SEEN /LPF
Ketones, ur: NEGATIVE
Leukocytes,Ua: NEGATIVE
Nitrite: NEGATIVE
RBC / HPF: NONE SEEN /HPF (ref 0–2)
Specific Gravity, Urine: 1.025 (ref 1.001–1.03)
Squamous Epithelial / LPF: NONE SEEN /HPF (ref <=5)
WBC, UA: NONE SEEN /HPF (ref 0–5)
pH: 5.5 (ref 5.0–8.0)

## 2018-08-30 LAB — CBC
HCT: 48.1 % (ref 38.5–50.0)
Hemoglobin: 17 g/dL (ref 13.2–17.1)
MCH: 33.5 pg — ABNORMAL HIGH (ref 27.0–33.0)
MCHC: 35.3 g/dL (ref 32.0–36.0)
MCV: 94.9 fL (ref 80.0–100.0)
MPV: 10.6 fL (ref 7.5–12.5)
Platelets: 206 10*3/uL (ref 140–400)
RBC: 5.07 10*6/uL (ref 4.20–5.80)
RDW: 13 % (ref 11.0–15.0)
WBC: 7 10*3/uL (ref 3.8–10.8)

## 2018-08-30 LAB — COMPLETE METABOLIC PANEL WITH GFR
AG Ratio: 1.9 (calc) (ref 1.0–2.5)
ALT: 62 U/L — ABNORMAL HIGH (ref 9–46)
AST: 34 U/L (ref 10–40)
Albumin: 4.5 g/dL (ref 3.6–5.1)
Alkaline phosphatase (APISO): 120 U/L (ref 36–130)
BUN: 13 mg/dL (ref 7–25)
CO2: 21 mmol/L (ref 20–32)
Calcium: 9.4 mg/dL (ref 8.6–10.3)
Chloride: 106 mmol/L (ref 98–110)
Creat: 0.96 mg/dL (ref 0.60–1.35)
GFR, Est African American: 109 mL/min/{1.73_m2} (ref >=60)
GFR, Est Non African American: 94 mL/min/{1.73_m2} (ref >=60)
Globulin: 2.4 g/dL (calc) (ref 1.9–3.7)
Glucose, Bld: 107 mg/dL — ABNORMAL HIGH (ref 65–99)
Potassium: 4.3 mmol/L (ref 3.5–5.3)
Sodium: 139 mmol/L (ref 135–146)
Total Bilirubin: 0.3 mg/dL (ref 0.2–1.2)
Total Protein: 6.9 g/dL (ref 6.1–8.1)

## 2018-08-30 LAB — LIPID PANEL
Cholesterol: 245 mg/dL — ABNORMAL HIGH (ref <200)
HDL: 31 mg/dL — ABNORMAL LOW (ref >=40)
Non-HDL Cholesterol (Calc): 214 mg/dL (calc) — ABNORMAL HIGH (ref <130)
Total CHOL/HDL Ratio: 7.9 (calc) — ABNORMAL HIGH (ref <5.0)
Triglycerides: 882 mg/dL — ABNORMAL HIGH (ref <150)

## 2018-09-01 ENCOUNTER — Encounter: Payer: Self-pay | Admitting: Physician Assistant

## 2018-09-01 ENCOUNTER — Other Ambulatory Visit: Payer: Self-pay | Admitting: Physician Assistant

## 2018-09-01 DIAGNOSIS — E785 Hyperlipidemia, unspecified: Secondary | ICD-10-CM

## 2018-09-01 DIAGNOSIS — R7303 Prediabetes: Secondary | ICD-10-CM | POA: Insufficient documentation

## 2018-09-01 DIAGNOSIS — I10 Essential (primary) hypertension: Secondary | ICD-10-CM

## 2018-09-01 DIAGNOSIS — R7401 Elevation of levels of liver transaminase levels: Secondary | ICD-10-CM | POA: Insufficient documentation

## 2018-09-01 DIAGNOSIS — E781 Pure hyperglyceridemia: Secondary | ICD-10-CM | POA: Insufficient documentation

## 2018-09-01 MED ORDER — ATORVASTATIN CALCIUM 40 MG PO TABS: 40.0000 mg | ORAL_TABLET | Freq: Every day | ORAL | 1 refills | Status: DC

## 2018-09-01 MED ORDER — OMEGA-3-ACID ETHYL ESTERS 1 G PO CAPS: 4.0000 g | ORAL_CAPSULE | Freq: Every day | ORAL | 1 refills | Status: DC

## 2018-09-01 MED ORDER — CANDESARTAN CILEXETIL-HCTZ 32-25 MG PO TABS: 1.0000 | ORAL_TABLET | Freq: Every day | ORAL | 1 refills | Status: DC

## 2018-09-01 NOTE — Addendum Note (Signed)
Addended by: Nelson Chimes E on: 09/01/2018 01:20 PM   Modules accepted: Orders

## 2018-09-25 ENCOUNTER — Ambulatory Visit (INDEPENDENT_AMBULATORY_CARE_PROVIDER_SITE_OTHER): Payer: Managed Care, Other (non HMO) | Admitting: Sports Medicine

## 2018-09-25 ENCOUNTER — Encounter: Payer: Self-pay | Admitting: Sports Medicine

## 2018-09-25 DIAGNOSIS — G5603 Carpal tunnel syndrome, bilateral upper limbs: Secondary | ICD-10-CM

## 2018-09-25 NOTE — Assessment & Plan Note (Signed)
Failed conservative measures and night splinting. Bilateral median nerve hydrodissection. Return to see me in 1 month to reevaluate response. Rehab exercises given.

## 2018-09-25 NOTE — Progress Notes (Signed)
Subjective:    CC: bilateral hand pain/numbness  HPI: Patient has had bilateral hand pain and numbness for about 5 years which has increased in severity. He notes that the pain radiates up the forearm extending to the antecubital fossa bilaterally. The numbness occurs in all fingers and both pain/numbness are worse at night, preventing him from sleeping more than a few hours each night. He has tried bilateral hand braces in the past and occasionally takes Tylenol for the pain, neither of which seems to help. 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 He denies neck/back pain.   I reviewed the past medical history, family history, social history, surgical history, and allergies today and no changes were needed.  Please see the problem list section below in epic for further details.  Past Medical History: Past Medical History:  Diagnosis Date  . Anxiety   . Closed rib fracture 2017   right  . Collar bone fracture 2017  . Depression   . Hypertension   . Insomnia   . Overweight   . Prediabetes   . Tobacco use    Past Surgical History: Past Surgical History:  Procedure Laterality Date  . NO PAST SURGERIES     Social History: Social History   Socioeconomic History  . Marital status: Significant Other    Spouse name: Not on file  . Number of children: Not on file  . Years of education: Not on file  . Highest education level: Not on file  Occupational History  . Not on file  Social Needs  . Financial resource strain: Not on file  . Food insecurity    Worry: Not on file    Inability: Not on file  . Transportation needs    Medical: Not on file    Non-medical: Not on file  Tobacco Use  . Smoking status: Current Every Day Smoker    Packs/day: 1.00    Years: 21.00    Pack years: 21.00    Types: Cigarettes  . Smokeless tobacco: Never Used  Substance and Sexual Activity  . Alcohol use: Not Currently  . Drug use: No  . Sexual  activity: Yes  Lifestyle  . Physical activity    Days per week: Not on file    Minutes per session: Not on file  . Stress: Not on file  Relationships  . Social Musicianconnections    Talks on phone: Not on file    Gets together: Not on file    Attends religious service: Not on file    Active member of club or organization: Not on file    Attends meetings of clubs or organizations: Not on file    Relationship status: Not on file  Other Topics Concern  . Not on file  Social History Narrative  . Not on file   Family History: Family History  Problem Relation Age of Onset  . Diabetes Mother   . Hypertension Mother   . Heart attack Mother   . Hypertension Father   . Diabetes Father   . Heart attack Father   . Heart attack Maternal Grandmother   . Heart attack Maternal Grandfather   . Heart attack Paternal Grandmother   . Heart attack Paternal Grandfather    Allergies: No Known Allergies Medications: See med rec.  Review of Systems: No fevers, chills, night sweats, weight loss, chest pain, or shortness of breath.   Objective:    General: Well Developed, well nourished, and in  no acute distress.  Neuro: Alert and oriented x3, extra-ocular muscles intact, sensation grossly intact.  HEENT: Normocephalic, atraumatic, pupils equal round reactive to light, neck supple, no masses, no lymphadenopathy, thyroid nonpalpable.  Skin: Warm and dry, no rashes. Cardiac: Regular rate and rhythm, no murmurs rubs or gallops, no lower extremity edema.  Respiratory: Clear to auscultation bilaterally. Not using accessory muscles, speaking in full sentences. MSK- Hands: positive Tinel's and Phalen's bilaterally. Negative Finkelstein. Full ROM/strength. No swelling or signs of injury.      Impression and Recommendations:   A: carpal tunnel syndrome bilaterally P: Patient has failed conservative therapies so he elected to have bilateral median nerve hydrodissection today. Pt was given rehab exercises.  Will return in 1 month to reelvaluate.   Carpal tunnel syndrome, bilateral Failed conservative measures and night splinting. Bilateral median nerve hydrodissection. Return to see me in 1 month to reevaluate response. Rehab exercises given.   ___________________________________________ Gwen Her. Dianah Field, M.D., ABFM., CAQSM. Primary Care and Sports Medicine [facility]  Adjunct Professor of [place] [employer] [facility] of Medicine

## 2018-10-23 ENCOUNTER — Ambulatory Visit: Payer: Managed Care, Other (non HMO) | Admitting: Sports Medicine

## 2018-10-31 ENCOUNTER — Other Ambulatory Visit: Payer: Self-pay

## 2018-10-31 ENCOUNTER — Ambulatory Visit (INDEPENDENT_AMBULATORY_CARE_PROVIDER_SITE_OTHER): Payer: Managed Care, Other (non HMO) | Admitting: Sports Medicine

## 2018-10-31 ENCOUNTER — Encounter: Payer: Self-pay | Admitting: Sports Medicine

## 2018-10-31 DIAGNOSIS — G5603 Carpal tunnel syndrome, bilateral upper limbs: Secondary | ICD-10-CM

## 2018-10-31 NOTE — Assessment & Plan Note (Signed)
Good results after bilateral median nerve hydrodissection at the last visit, he tells me he slept better over the past month than he has over the past 10 years. Return as needed.

## 2018-10-31 NOTE — Progress Notes (Signed)
Subjective:    CC: Follow-up  HPI: Bilateral carpal tunnel syndrome: Resolved.  I reviewed the past medical history, family history, social history, surgical history, and allergies today and no changes were needed.  Please see the problem list section below in epic for further details.  Past Medical History: Past Medical History:  Diagnosis Date  . Anxiety   . Closed rib fracture 2017   right  . Collar bone fracture 2017  . Depression   . Hypertension   . Insomnia   . Overweight   . Prediabetes   . Tobacco use    Past Surgical History: Past Surgical History:  Procedure Laterality Date  . NO PAST SURGERIES     Social History: Social History   Socioeconomic History  . Marital status: Significant Other    Spouse name: Not on file  . Number of children: Not on file  . Years of education: Not on file  . Highest education level: Not on file  Occupational History  . Not on file  Social Needs  . Financial resource strain: Not on file  . Food insecurity    Worry: Not on file    Inability: Not on file  . Transportation needs    Medical: Not on file    Non-medical: Not on file  Tobacco Use  . Smoking status: Current Every Day Smoker    Packs/day: 1.00    Years: 21.00    Pack years: 21.00    Types: Cigarettes  . Smokeless tobacco: Never Used  Substance and Sexual Activity  . Alcohol use: Not Currently  . Drug use: No  . Sexual activity: Yes  Lifestyle  . Physical activity    Days per week: Not on file    Minutes per session: Not on file  . Stress: Not on file  Relationships  . Social Musicianconnections    Talks on phone: Not on file    Gets together: Not on file    Attends religious service: Not on file    Active member of club or organization: Not on file    Attends meetings of clubs or organizations: Not on file    Relationship status: Not on file  Other Topics Concern  . Not on file  Social History Narrative  . Not on file   Family History: Family  History  Problem Relation Age of Onset  . Diabetes Mother   . Hypertension Mother   . Heart attack Mother   . Hypertension Father   . Diabetes Father   . Heart attack Father   . Heart attack Maternal Grandmother   . Heart attack Maternal Grandfather   . Heart attack Paternal Grandmother   . Heart attack Paternal Grandfather    Allergies: No Known Allergies Medications: See med rec.  Review of Systems: No fevers, chills, night sweats, weight loss, chest pain, or shortness of breath.   Objective:    General: Well Developed, well nourished, and in no acute distress.  Neuro: Alert and oriented x3, extra-ocular muscles intact, sensation grossly intact.  HEENT: Normocephalic, atraumatic, pupils equal round reactive to light, neck supple, no masses, no lymphadenopathy, thyroid nonpalpable.  Skin: Warm and dry, no rashes. Cardiac: Regular rate and rhythm, no murmurs rubs or gallops, no lower extremity edema.  Respiratory: Clear to auscultation bilaterally. Not using accessory muscles, speaking in full sentences.  Impression and Recommendations:    Carpal tunnel syndrome, bilateral Good results after bilateral median nerve hydrodissection at the last visit, he tells me  he slept better over the past month than he has over the past 10 years. Return as needed.   ___________________________________________ Gwen Her. Dianah Field, M.D., ABFM., CAQSM. Primary Care and Sports Medicine [facility]  Adjunct Professor of [place] [employer] [facility] [employer]

## 2019-02-10 ENCOUNTER — Other Ambulatory Visit: Payer: Self-pay

## 2019-02-10 ENCOUNTER — Ambulatory Visit (INDEPENDENT_AMBULATORY_CARE_PROVIDER_SITE_OTHER): Payer: Managed Care, Other (non HMO) | Admitting: Sports Medicine

## 2019-02-10 ENCOUNTER — Encounter: Payer: Self-pay | Admitting: Sports Medicine

## 2019-02-10 DIAGNOSIS — G5603 Carpal tunnel syndrome, bilateral upper limbs: Secondary | ICD-10-CM

## 2019-02-10 MED ORDER — GABAPENTIN 300 MG PO CAPS: ORAL_CAPSULE | ORAL | 3 refills | Status: DC

## 2019-02-10 NOTE — Assessment & Plan Note (Signed)
Did extremely well after bilateral median nerve hydrodissection back in July, over the past few weeks he has had a recurrence of symptoms, he is interested in surgery rather than additional injections, referral to Dr. Amedeo Plenty, I am also going to go ahead and pull the trigger for nerve conduction and EMG, adding gabapentin at bedtime in the meantime. Return to see me as needed.

## 2019-02-10 NOTE — Progress Notes (Signed)
Subjective:    CC: Follow-up  HPI: This is a pleasant 48 year old male, he has clinically what appeared to be bilateral carpal tunnel syndrome, after failure of conservative measures we performed bilateral median nerve Hydro dissections and he did extremely well until just recently.  He is having recurrence of symptoms, pain in the volar hands, paresthesias into the fingertips, rating to the mid forearm proximally.  At this point he does not desire any further injections and is interested in surgical options.  I reviewed the past medical history, family history, social history, surgical history, and allergies today and no changes were needed.  Please see the problem list section below in epic for further details.  Past Medical History: Past Medical History:  Diagnosis Date  . Anxiety   . Closed rib fracture 2017   right  . Collar bone fracture 2017  . Depression   . Hypertension   . Insomnia   . Overweight   . Prediabetes   . Tobacco use    Past Surgical History: Past Surgical History:  Procedure Laterality Date  . NO PAST SURGERIES     Social History: Social History   Socioeconomic History  . Marital status: Significant Other    Spouse name: Not on file  . Number of children: Not on file  . Years of education: Not on file  . Highest education level: Not on file  Occupational History  . Not on file  Social Needs  . Financial resource strain: Not on file  . Food insecurity    Worry: Not on file    Inability: Not on file  . Transportation needs    Medical: Not on file    Non-medical: Not on file  Tobacco Use  . Smoking status: Current Every Day Smoker    Packs/day: 1.00    Years: 21.00    Pack years: 21.00    Types: Cigarettes  . Smokeless tobacco: Never Used  Substance and Sexual Activity  . Alcohol use: Not Currently  . Drug use: No  . Sexual activity: Yes  Lifestyle  . Physical activity    Days per week: Not on file    Minutes per session: Not on file   . Stress: Not on file  Relationships  . Social Herbalist on phone: Not on file    Gets together: Not on file    Attends religious service: Not on file    Active member of club or organization: Not on file    Attends meetings of clubs or organizations: Not on file    Relationship status: Not on file  Other Topics Concern  . Not on file  Social History Narrative  . Not on file   Family History: Family History  Problem Relation Age of Onset  . Diabetes Mother   . Hypertension Mother   . Heart attack Mother   . Hypertension Father   . Diabetes Father   . Heart attack Father   . Heart attack Maternal Grandmother   . Heart attack Maternal Grandfather   . Heart attack Paternal Grandmother   . Heart attack Paternal Grandfather    Allergies: No Known Allergies Medications: See med rec.  Review of Systems: No fevers, chills, night sweats, weight loss, chest pain, or shortness of breath.   Objective:    General: Well Developed, well nourished, and in no acute distress.  Neuro: Alert and oriented x3, extra-ocular muscles intact, sensation grossly intact.  HEENT: Normocephalic, atraumatic, pupils equal round  reactive to light, neck supple, no masses, no lymphadenopathy, thyroid nonpalpable.  Skin: Warm and dry, no rashes. Cardiac: Regular rate and rhythm, no murmurs rubs or gallops, no lower extremity edema.  Respiratory: Clear to auscultation bilaterally. Not using accessory muscles, speaking in full sentences.  Impression and Recommendations:    Carpal tunnel syndrome, bilateral Did extremely well after bilateral median nerve hydrodissection back in July, over the past few weeks he has had a recurrence of symptoms, he is interested in surgery rather than additional injections, referral to Dr. Amanda Pea, I am also going to go ahead and pull the trigger for nerve conduction and EMG, adding gabapentin at bedtime in the meantime. Return to see me as needed.    ___________________________________________ Ihor Austin. Benjamin Stain, M.D., ABFM., CAQSM. Primary Care and Sports Medicine [facility]  Adjunct Professor of Family Medicine  [employer]

## 2019-02-23 ENCOUNTER — Telehealth: Payer: Self-pay | Admitting: Sports Medicine

## 2019-02-23 NOTE — Telephone Encounter (Signed)
Patient called in asking about his medication concerning his pain. The medication is not working for Danny Lyons.  Please advise.

## 2019-02-23 NOTE — Telephone Encounter (Signed)
We had done a referral for him to have carpal tunnel release surgery with Dr. Amedeo Plenty.  If he cannot wait that long then certainly another injection would give him some immediate relief.

## 2019-02-24 NOTE — Telephone Encounter (Signed)
Yep, would interfere with surgery.  Maybe try a stronger medication?

## 2019-02-24 NOTE — Telephone Encounter (Signed)
Pt has appointment with Dr. Vanetta Shawl office on 04/02/19.  Can he still get an injection this soon to that appointment?  Will it interfere with getting the surgery sooner? Please advise.

## 2019-03-05 NOTE — Telephone Encounter (Signed)
No answer, no voicemail. Will try to call patient back at a later time. (1st attempt)  

## 2019-03-09 NOTE — Telephone Encounter (Signed)
Called again, no answer and no VM.

## 2019-09-26 ENCOUNTER — Other Ambulatory Visit: Payer: Self-pay | Admitting: Physician Assistant

## 2019-09-26 DIAGNOSIS — I10 Essential (primary) hypertension: Secondary | ICD-10-CM

## 2019-09-30 ENCOUNTER — Other Ambulatory Visit: Payer: Self-pay

## 2019-09-30 DIAGNOSIS — I10 Essential (primary) hypertension: Secondary | ICD-10-CM

## 2019-09-30 DIAGNOSIS — E785 Hyperlipidemia, unspecified: Secondary | ICD-10-CM

## 2019-09-30 MED ORDER — ATORVASTATIN CALCIUM 40 MG PO TABS: 40.0000 mg | ORAL_TABLET | Freq: Every day | ORAL | 0 refills | Status: DC

## 2019-09-30 MED ORDER — CANDESARTAN CILEXETIL-HCTZ 32-25 MG PO TABS: 1.0000 | ORAL_TABLET | Freq: Every day | ORAL | 0 refills | Status: DC

## 2019-09-30 NOTE — Telephone Encounter (Signed)
Appointment needed to switch care. Attempted to reach patient. Number on file is wrong.

## 2019-09-30 NOTE — Telephone Encounter (Signed)
Jillyn Hidden requests a refill on atorvastatin and candesartan. Refill sent after appointment made to establish care.

## 2019-10-07 ENCOUNTER — Encounter: Payer: Self-pay | Admitting: Family Medicine

## 2019-10-07 ENCOUNTER — Ambulatory Visit (INDEPENDENT_AMBULATORY_CARE_PROVIDER_SITE_OTHER): Payer: Managed Care, Other (non HMO) | Admitting: Family Medicine

## 2019-10-07 ENCOUNTER — Other Ambulatory Visit: Payer: Self-pay

## 2019-10-07 VITALS — BP 112/65 | HR 81 | Temp 98.0°F | Ht 68.11 in | Wt 186.0 lb

## 2019-10-07 DIAGNOSIS — E781 Pure hyperglyceridemia: Secondary | ICD-10-CM

## 2019-10-07 DIAGNOSIS — Z0000 Encounter for general adult medical examination without abnormal findings: Secondary | ICD-10-CM | POA: Diagnosis not present

## 2019-10-07 DIAGNOSIS — R7303 Prediabetes: Secondary | ICD-10-CM

## 2019-10-07 DIAGNOSIS — I10 Essential (primary) hypertension: Secondary | ICD-10-CM | POA: Diagnosis not present

## 2019-10-07 DIAGNOSIS — E785 Hyperlipidemia, unspecified: Secondary | ICD-10-CM

## 2019-10-07 DIAGNOSIS — F17210 Nicotine dependence, cigarettes, uncomplicated: Secondary | ICD-10-CM

## 2019-10-07 DIAGNOSIS — F3341 Major depressive disorder, recurrent, in partial remission: Secondary | ICD-10-CM

## 2019-10-07 MED ORDER — OMEGA-3-ACID ETHYL ESTERS 1 G PO CAPS: 4.0000 g | ORAL_CAPSULE | Freq: Every day | ORAL | 3 refills | Status: DC

## 2019-10-07 MED ORDER — ATORVASTATIN CALCIUM 40 MG PO TABS: 40.0000 mg | ORAL_TABLET | Freq: Every day | ORAL | 3 refills | Status: DC

## 2019-10-07 MED ORDER — BUPROPION HCL ER (XL) 150 MG PO TB24
150.0000 mg | ORAL_TABLET | Freq: Every day | ORAL | 1 refills | Status: DC
Start: 2019-10-07 — End: 2020-11-22

## 2019-10-07 MED ORDER — CANDESARTAN CILEXETIL-HCTZ 32-25 MG PO TABS: 1.0000 | ORAL_TABLET | Freq: Every day | ORAL | 3 refills | Status: DC

## 2019-10-07 NOTE — Assessment & Plan Note (Signed)
Well adult Orders Placed This Encounter  Procedures   COMPLETE METABOLIC PANEL WITH GFR   CBC   Lipid Profile   TSH   HgB A1c  Immunizations: UTD Screening: lipid Anticipatory guidance:  Recommendations per AVS

## 2019-10-07 NOTE — Assessment & Plan Note (Signed)
Counseled on smoking cessation.  Recommend he quit but Doesn't want to quit at this time.  Discussed that bupropion may be helpful for quitting.

## 2019-10-07 NOTE — Assessment & Plan Note (Signed)
Having sexual side effects related to citalopram, will wean and start bupropion to replace this.   Return in about 8 weeks (around 12/02/2019) for f/u bupropion.

## 2019-10-07 NOTE — Progress Notes (Signed)
Danny Lyons. - 49 y.o. male MRN [id number]  Date of birth: [birth year]  Subjective Chief Complaint  Patient presents with  . Annual Exam    HPI Danny Lyons is a 49 y.o. male here today for annual exam.  He has a history of HTN which has remained well controlled with candesartan-hctz as well as HLD which he takes lipitor and lovaza for.  He is also prescribed citalopram for what sounds like concentration difficulties and issues with jumping from task to task.  He reports having decreased libido since starting this medication.    He is a current daily smoker.  He has not interest in quitting at this time.  Reports that father recently dx with stage IV cancer.  He consumes  Couple of servings of EtOH on the weekends occasionally.   He does stay pretty active and has made changes to his diet to be a little healthier.   Review of Systems  Constitutional: Negative for chills, fever, malaise/fatigue and weight loss.  HENT: Negative for congestion, ear pain and sore throat.   Eyes: Negative for blurred vision, double vision and pain.  Respiratory: Negative for cough and shortness of breath.   Cardiovascular: Negative for chest pain and palpitations.  Gastrointestinal: Negative for abdominal pain, blood in stool, constipation, heartburn and nausea.  Genitourinary: Negative for dysuria and urgency.  Musculoskeletal: Negative for joint pain and myalgias.  Neurological: Negative for dizziness and headaches.  Endo/Heme/Allergies: Does not bruise/bleed easily.  Psychiatric/Behavioral: Negative for depression. The patient is not nervous/anxious and does not have insomnia.     No Known Allergies  Past Medical History:  Diagnosis Date  . Anxiety   . Closed rib fracture 2017   right  . Collar bone fracture 2017  . Depression   . Hypertension   . Insomnia   . Overweight   . Prediabetes   . Tobacco use     Past Surgical History:  Procedure Laterality Date  . NO PAST SURGERIES       Social History   Socioeconomic History  . Marital status: Significant Other    Spouse name: Not on file  . Number of children: Not on file  . Years of education: Not on file  . Highest education level: Not on file  Occupational History  . Not on file  Tobacco Use  . Smoking status: Current Every Day Smoker    Packs/day: 1.00    Years: 21.00    Pack years: 21.00    Types: Cigarettes  . Smokeless tobacco: Never Used  Vaping Use  . Vaping Use: Never used  Substance and Sexual Activity  . Alcohol use: Not Currently  . Drug use: No  . Sexual activity: Yes  Other Topics Concern  . Not on file  Social History Narrative  . Not on file   Social Determinants of Health   Financial Resource Strain:   . Difficulty of Paying Living Expenses:   Food Insecurity:   . Worried About Programme researcher, broadcasting/film/video in the Last Year:   . Barista in the Last Year:   Transportation Needs:   . Freight forwarder (Medical):   Marland Kitchen Lack of Transportation (Non-Medical):   Physical Activity:   . Days of Exercise per Week:   . Minutes of Exercise per Session:   Stress:   . Feeling of Stress :   Social Connections:   . Frequency of Communication with Friends and Family:   .  Frequency of Social Gatherings with Friends and Family:   . Attends Religious Services:   . Active Member of Clubs or Organizations:   . Attends Banker Meetings:   Marland Kitchen Marital Status:     Family History  Problem Relation Age of Onset  . Diabetes Mother   . Hypertension Mother   . Heart attack Mother   . Hypertension Father   . Diabetes Father   . Heart attack Father   . Heart attack Maternal Grandmother   . Heart attack Maternal Grandfather   . Heart attack Paternal Grandmother   . Heart attack Paternal Grandfather     Health Maintenance  Topic Date Due  . Hepatitis C Screening  Never done  . HIV Screening  Never done  . COVID-19 Vaccine (1) 10/06/2020 (Originally [birth year]1984)  . INFLUENZA  VACCINE  10/25/2019  . TETANUS/TDAP  11/23/2023     ----------------------------------------------------------------------------------------------------------------------------------------------------------------------------------------------------------------- Physical Exam BP 112/65 (BP Location: Left Arm, Patient Position: Sitting, Cuff Size: Normal)   Pulse 81   Temp 98 F (36.7 C) (Temporal)   Ht 5' 8.11" (1.73 m)   Wt 186 lb (84.4 kg)   SpO2 98%   BMI 28.19 kg/m   Physical Exam Constitutional:      General: He is not in acute distress. HENT:     Head: Normocephalic and atraumatic.     Right Ear: Tympanic membrane and external ear normal.     Left Ear: Tympanic membrane and external ear normal.  Eyes:     General: No scleral icterus. Neck:     Thyroid: No thyromegaly.  Cardiovascular:     Rate and Rhythm: Normal rate and regular rhythm.     Heart sounds: Normal heart sounds.  Pulmonary:     Effort: Pulmonary effort is normal.     Breath sounds: Normal breath sounds.  Abdominal:     General: Bowel sounds are normal. There is no distension.     Palpations: Abdomen is soft.     Tenderness: There is no abdominal tenderness. There is no guarding.  Musculoskeletal:     Cervical back: Normal range of motion.  Lymphadenopathy:     Cervical: No cervical adenopathy.  Skin:    General: Skin is warm and dry.     Findings: No rash.  Neurological:     Mental Status: He is alert and oriented to person, place, and time.     Cranial Nerves: No cranial nerve deficit.     Motor: No abnormal muscle tone.  Psychiatric:        Mood and Affect: Mood normal.        Behavior: Behavior normal.     ------------------------------------------------------------------------------------------------------------------------------------------------------------------------------------------------------------------- Assessment and Plan  Heavy tobacco smoker who smokes more than 10  cigarettes per day Counseled on smoking cessation.  Recommend he quit but Doesn't want to quit at this time.  Discussed that bupropion may be helpful for quitting.   Hypertension goal BP (blood pressure) < 130/80 Blood pressure is at goal at for age and co-morbidities.  I recommend he continue current medication.  In addition they were instructed to follow a low sodium diet with regular exercise to help to maintain adequate control of blood pressure.    Well adult exam Well adult Orders Placed This Encounter  Procedures  . COMPLETE METABOLIC PANEL WITH GFR  . CBC  . Lipid Profile  . TSH  . HgB A1c  Immunizations: UTD Screening: lipid Anticipatory guidance:  Recommendations per AVS  Recurrent major  depressive disorder, in partial remission (HCC) Having sexual side effects related to citalopram, will wean and start bupropion to replace this.   Return in about 8 weeks (around 12/02/2019) for f/u bupropion.    Meds ordered this encounter  Medications  . buPROPion (WELLBUTRIN XL) 150 MG 24 hr tablet    Sig: Take 1 tablet (150 mg total) by mouth daily.    Dispense:  90 tablet    Refill:  1  . atorvastatin (LIPITOR) 40 MG tablet    Sig: Take 1 tablet (40 mg total) by mouth daily.    Dispense:  90 tablet    Refill:  3  . omega-3 acid ethyl esters (LOVAZA) 1 g capsule    Sig: Take 4 capsules (4 g total) by mouth daily.    Dispense:  360 capsule    Refill:  3  . Candesartan Cilexetil-HCTZ 32-25 MG TABS    Sig: Take 1 tablet by mouth daily.    Dispense:  90 tablet    Refill:  3    Return in about 8 weeks (around 12/02/2019) for f/u bupropion.    This visit occurred during the SARS-CoV-2 public health emergency.  Safety protocols were in place, including screening questions prior to the visit, additional usage of staff PPE, and extensive cleaning of exam room while observing appropriate contact time as indicated for disinfecting solutions.

## 2019-10-07 NOTE — Patient Instructions (Addendum)
Reduce citalopram to 1/2 tab for 1 week, then every other day for 1 week, then stop.  You can go ahead and start bupropion now.      Preventive Care 64-47 Years Old, Male Preventive care refers to lifestyle choices and visits with your health care provider that can promote health and wellness. This includes:  A yearly physical exam. This is also called an annual well check.  Regular dental and eye exams.  Immunizations.  Screening for certain conditions.  Healthy lifestyle choices, such as eating a healthy diet, getting regular exercise, not using drugs or products that contain nicotine and tobacco, and limiting alcohol use. What can I expect for my preventive care visit? Physical exam Your health care provider will check:  Height and weight. These may be used to calculate body mass index (BMI), which is a measurement that tells if you are at a healthy weight.  Heart rate and blood pressure.  Your skin for abnormal spots. Counseling Your health care provider may ask you questions about:  Alcohol, tobacco, and drug use.  Emotional well-being.  Home and relationship well-being.  Sexual activity.  Eating habits.  Work and work Statistician. What immunizations do I need?  Influenza (flu) vaccine  This is recommended every year. Tetanus, diphtheria, and pertussis (Tdap) vaccine  You may need a Td booster every 10 years. Varicella (chickenpox) vaccine  You may need this vaccine if you have not already been vaccinated. Zoster (shingles) vaccine  You may need this after age 42. Measles, mumps, and rubella (MMR) vaccine  You may need at least one dose of MMR if you were born in 1957 or later. You may also need a second dose. Pneumococcal conjugate (PCV13) vaccine  You may need this if you have certain conditions and were not previously vaccinated. Pneumococcal polysaccharide (PPSV23) vaccine  You may need one or two doses if you smoke cigarettes or if you have  certain conditions. Meningococcal conjugate (MenACWY) vaccine  You may need this if you have certain conditions. Hepatitis A vaccine  You may need this if you have certain conditions or if you travel or work in places where you may be exposed to hepatitis A. Hepatitis B vaccine  You may need this if you have certain conditions or if you travel or work in places where you may be exposed to hepatitis B. Haemophilus influenzae type b (Hib) vaccine  You may need this if you have certain risk factors. Human papillomavirus (HPV) vaccine  If recommended by your health care provider, you may need three doses over 6 months. You may receive vaccines as individual doses or as more than one vaccine together in one shot (combination vaccines). Talk with your health care provider about the risks and benefits of combination vaccines. What tests do I need? Blood tests  Lipid and cholesterol levels. These may be checked every 5 years, or more frequently if you are over 45 years old.  Hepatitis C test.  Hepatitis B test. Screening  Lung cancer screening. You may have this screening every year starting at age 47 if you have a 30-pack-year history of smoking and currently smoke or have quit within the past 15 years.  Prostate cancer screening. Recommendations will vary depending on your family history and other risks.  Colorectal cancer screening. All adults should have this screening starting at age 68 and continuing until age 57. Your health care provider may recommend screening at age 61 if you are at increased risk. You will have  tests every 1-10 years, depending on your results and the type of screening test.  Diabetes screening. This is done by checking your blood sugar (glucose) after you have not eaten for a while (fasting). You may have this done every 1-3 years.  Sexually transmitted disease (STD) testing. Follow these instructions at home: Eating and drinking  Eat a diet that includes  fresh fruits and vegetables, whole grains, lean protein, and low-fat dairy products.  Take vitamin and mineral supplements as recommended by your health care provider.  Do not drink alcohol if your health care provider tells you not to drink.  If you drink alcohol: ? Limit how much you have to 0-2 drinks a day. ? Be aware of how much alcohol is in your drink. In the [place] one drink equals one 12 oz bottle of beer (355 mL), one 5 oz glass of wine (148 mL), or one 1 oz glass of hard liquor (44 mL). Lifestyle  Take daily care of your teeth and gums.  Stay active. Exercise for at least 30 minutes on 5 or more days each week.  Do not use any products that contain nicotine or tobacco, such as cigarettes, e-cigarettes, and chewing tobacco. If you need help quitting, ask your health care provider.  If you are sexually active, practice safe sex. Use a condom or other form of protection to prevent STIs (sexually transmitted infections).  Talk with your health care provider about taking a low-dose aspirin every day starting at age 48. What's next?  Go to your health care provider once a year for a well check visit.  Ask your health care provider how often you should have your eyes and teeth checked.  Stay up to date on all vaccines. This information is not intended to replace advice given to you by your health care provider. Make sure you discuss any questions you have with your health care provider. Document Revised: 03/06/2018 Document Reviewed: 03/06/2018 Elsevier Patient Education  2020 [employer].

## 2019-10-07 NOTE — Assessment & Plan Note (Signed)
Blood pressure is at goal at for age and co-morbidities.  I recommend he continue current medication.  In addition they were instructed to follow a low sodium diet with regular exercise to help to maintain adequate control of blood pressure.

## 2019-10-08 LAB — COMPLETE METABOLIC PANEL WITH GFR
AG Ratio: 1.7 (calc) (ref 1.0–2.5)
ALT: 38 U/L (ref 9–46)
AST: 23 U/L (ref 10–40)
Albumin: 4.4 g/dL (ref 3.6–5.1)
Alkaline phosphatase (APISO): 93 U/L (ref 36–130)
BUN: 15 mg/dL (ref 7–25)
CO2: 29 mmol/L (ref 20–32)
Calcium: 9.4 mg/dL (ref 8.6–10.3)
Chloride: 100 mmol/L (ref 98–110)
Creat: 1.1 mg/dL (ref 0.60–1.35)
GFR, Est African American: 92 mL/min/{1.73_m2} (ref >=60)
GFR, Est Non African American: 80 mL/min/{1.73_m2} (ref >=60)
Globulin: 2.6 g/dL (calc) (ref 1.9–3.7)
Glucose, Bld: 106 mg/dL — ABNORMAL HIGH (ref 65–99)
Potassium: 4.6 mmol/L (ref 3.5–5.3)
Sodium: 137 mmol/L (ref 135–146)
Total Bilirubin: 0.6 mg/dL (ref 0.2–1.2)
Total Protein: 7 g/dL (ref 6.1–8.1)

## 2019-10-08 LAB — CBC
HCT: 47 % (ref 38.5–50.0)
Hemoglobin: 16.6 g/dL (ref 13.2–17.1)
MCH: 32.8 pg (ref 27.0–33.0)
MCHC: 35.3 g/dL (ref 32.0–36.0)
MCV: 92.9 fL (ref 80.0–100.0)
MPV: 10.5 fL (ref 7.5–12.5)
Platelets: 196 10*3/uL (ref 140–400)
RBC: 5.06 10*6/uL (ref 4.20–5.80)
RDW: 12.5 % (ref 11.0–15.0)
WBC: 10.4 10*3/uL (ref 3.8–10.8)

## 2019-10-08 LAB — TSH: TSH: 1.06 mIU/L (ref 0.40–4.50)

## 2019-10-08 LAB — LIPID PANEL
Cholesterol: 163 mg/dL (ref <200)
HDL: 40 mg/dL (ref >=40)
LDL Cholesterol (Calc): 94 mg/dL (calc)
Non-HDL Cholesterol (Calc): 123 mg/dL (calc) (ref <130)
Total CHOL/HDL Ratio: 4.1 (calc) (ref <5.0)
Triglycerides: 195 mg/dL — ABNORMAL HIGH (ref <150)

## 2019-10-08 LAB — HEMOGLOBIN A1C
Hgb A1c MFr Bld: 6 % of total Hgb — ABNORMAL HIGH (ref <5.7)
Mean Plasma Glucose: 126 (calc)
eAG (mmol/L): 7 (calc)

## 2019-10-26 ENCOUNTER — Ambulatory Visit: Payer: Managed Care, Other (non HMO) | Admitting: Sports Medicine

## 2019-10-27 ENCOUNTER — Encounter: Payer: Self-pay | Admitting: Family Medicine

## 2019-10-27 ENCOUNTER — Telehealth (INDEPENDENT_AMBULATORY_CARE_PROVIDER_SITE_OTHER): Payer: Managed Care, Other (non HMO) | Admitting: Family Medicine

## 2019-10-27 ENCOUNTER — Ambulatory Visit: Payer: Managed Care, Other (non HMO) | Admitting: Sports Medicine

## 2019-10-27 DIAGNOSIS — U071 COVID-19: Secondary | ICD-10-CM | POA: Diagnosis not present

## 2019-10-27 MED ORDER — DEXAMETHASONE 6 MG PO TABS
6.0000 mg | ORAL_TABLET | Freq: Every day | ORAL | 0 refills | Status: DC
Start: 2019-10-27 — End: 2019-12-10

## 2019-10-27 MED ORDER — HYDROCODONE-HOMATROPINE 5-1.5 MG/5ML PO SYRP: 5.0000 mL | ORAL_SOLUTION | Freq: Three times a day (TID) | ORAL | 0 refills | Status: DC | PRN

## 2019-10-27 NOTE — Progress Notes (Signed)
Vomiting Fever Diarrhea Chest tightness  COVID test = positive today

## 2019-10-27 NOTE — Progress Notes (Signed)
Danny Lyons. - 49 y.o. male MRN [id number]  Date of birth: [birth year]   This visit type was conducted due to national recommendations for restrictions regarding the COVID-19 Pandemic (e.g. social distancing).  This format is felt to be most appropriate for this patient at this time.  All issues noted in this document were discussed and addressed.  No physical exam was performed (except for noted visual exam findings with Video Visits).  I discussed the limitations of evaluation and management by telemedicine and the availability of in person appointments. The patient expressed understanding and agreed to proceed.  I connected with@ on 10/27/19 at  1:00 PM EDT by a video enabled telemedicine application and verified that I am speaking with the correct person using two identifiers.  Present at visit: Everrett Coombe, DO [name].   Patient Location: Home [address] [place] [address]   Provider location:   [facility]  Chief Complaint  Patient presents with  . Cough    HPI  Danny Lyons is a 49 y.o. male who presents via audio/video conferencing for a telehealth visit today.  He reports testing + for COVID-19 yesterday.  Current symptoms including body aches, headache, cough, shortness of breath, chest tightness, nausea, vomiting, diarrhea.  He has fatigue but not severe.  No wheezing.   He has tried tylenol and ibuprofen but hasn't been very effective for body aches.  He is eating and drinking ok.     ROS:  A comprehensive ROS was completed and negative except as noted per HPI  Past Medical History:  Diagnosis Date  . Anxiety   . Closed rib fracture 2017   right  . Collar bone fracture 2017  . Depression   . Hypertension   . Insomnia   . Overweight   . Prediabetes   . Tobacco use     Past Surgical History:  Procedure Laterality Date  . NO PAST SURGERIES      Family History  Problem Relation Age of Onset  . Diabetes Mother   . Hypertension Mother   . Heart  attack Mother   . Hypertension Father   . Diabetes Father   . Heart attack Father   . Heart attack Maternal Grandmother   . Heart attack Maternal Grandfather   . Heart attack Paternal Grandmother   . Heart attack Paternal Grandfather     Social History   Socioeconomic History  . Marital status: Significant Other    Spouse name: Not on file  . Number of children: Not on file  . Years of education: Not on file  . Highest education level: Not on file  Occupational History  . Not on file  Tobacco Use  . Smoking status: Current Every Day Smoker    Packs/day: 1.00    Years: 21.00    Pack years: 21.00    Types: Cigarettes  . Smokeless tobacco: Never Used  Vaping Use  . Vaping Use: Never used  Substance and Sexual Activity  . Alcohol use: Not Currently  . Drug use: No  . Sexual activity: Yes  Other Topics Concern  . Not on file  Social History Narrative  . Not on file   Social Determinants of Health   Financial Resource Strain:   . Difficulty of Paying Living Expenses:   Food Insecurity:   . Worried About Programme researcher, broadcasting/film/video in the Last Year:   . [employer] of Food in the Last Year:  Transportation Needs:   . Freight forwarder (Medical):   Marland Kitchen Lack of Transportation (Non-Medical):   Physical Activity:   . Days of Exercise per Week:   . Minutes of Exercise per Session:   Stress:   . Feeling of Stress :   Social Connections:   . Frequency of Communication with Friends and Family:   . Frequency of Social Gatherings with Friends and Family:   . Attends Religious Services:   . Active Member of Clubs or Organizations:   . Attends Banker Meetings:   Marland Kitchen Marital Status:   Intimate Partner Violence:   . Fear of Current or Ex-Partner:   . Emotionally Abused:   Marland Kitchen Physically Abused:   . Sexually Abused:      Current Outpatient Medications:  .  aspirin EC 81 MG tablet, Take 1 tablet (81 mg total) by mouth daily., Disp: 90 tablet, Rfl: 3 .  atorvastatin  (LIPITOR) 40 MG tablet, Take 1 tablet (40 mg total) by mouth daily., Disp: 90 tablet, Rfl: 3 .  buPROPion (WELLBUTRIN XL) 150 MG 24 hr tablet, Take 1 tablet (150 mg total) by mouth daily., Disp: 90 tablet, Rfl: 1 .  Candesartan Cilexetil-HCTZ 32-25 MG TABS, Take 1 tablet by mouth daily., Disp: 90 tablet, Rfl: 3 .  dexamethasone (DECADRON) 6 MG tablet, Take 1 tablet (6 mg total) by mouth daily., Disp: 10 tablet, Rfl: 0 .  HYDROcodone-homatropine (HYCODAN) 5-1.5 MG/5ML syrup, Take 5 mLs by mouth every 8 (eight) hours as needed for cough., Disp: 120 mL, Rfl: 0 .  omega-3 acid ethyl esters (LOVAZA) 1 g capsule, Take 4 capsules (4 g total) by mouth daily., Disp: 360 capsule, Rfl: 3  EXAM:  VITALS per patient if applicable: Wt 182 lb (82.6 kg)   BMI 27.58 kg/m   GENERAL: alert, oriented, appears well and in no acute distress  HEENT: atraumatic, conjunttiva clear, no obvious abnormalities on inspection of external nose and ears  NECK: normal movements of the head and neck  LUNGS: on inspection no signs of respiratory distress, breathing rate appears normal, no obvious gross SOB, gasping or wheezing  CV: no obvious cyanosis  MS: moves all visible extremities without noticeable abnormality  PSYCH/NEURO: pleasant and cooperative, no obvious depression or anxiety, speech and thought processing grossly intact  ASSESSMENT AND PLAN:  Discussed the following assessment and plan:  COVID-19 Moderate symptoms He is a smoker as well.  Will start dexamethasone 6mg  daily x10 days  Hycodan cough syrup.  Instructions given to go to hospital for worsening of symptoms including increasing fatigue, shortness of breath, inability to meet fluid or nutritional needs.       I discussed the assessment and treatment plan with the patient. The patient was provided an opportunity to ask questions and all were answered. The patient agreed with the plan and demonstrated an understanding of the instructions.    The patient was advised to call back or seek an in-person evaluation if the symptoms worsen or if the condition fails to improve as anticipated.    , DO

## 2019-10-27 NOTE — Assessment & Plan Note (Signed)
Moderate symptoms He is a smoker as well.  Will start dexamethasone 6mg  daily x10 days  Hycodan cough syrup.  Instructions given to go to hospital for worsening of symptoms including increasing fatigue, shortness of breath, inability to meet fluid or nutritional needs.

## 2019-11-03 ENCOUNTER — Telehealth: Payer: Self-pay

## 2019-11-03 MED ORDER — HYDROCODONE-HOMATROPINE 5-1.5 MG/5ML PO SYRP: 5.0000 mL | ORAL_SOLUTION | Freq: Three times a day (TID) | ORAL | 0 refills | Status: DC | PRN

## 2019-11-03 MED ORDER — ALBUTEROL SULFATE HFA 108 (90 BASE) MCG/ACT IN AERS: 2.0000 | INHALATION_SPRAY | Freq: Four times a day (QID) | RESPIRATORY_TRACT | 0 refills | Status: DC | PRN

## 2019-11-03 NOTE — Telephone Encounter (Signed)
Albuterol inhaler and refill on cough syrup sent to [facility]. Note completed and sent to patient via MyChart. Printed copy of letter faxed to work per patient request.

## 2019-11-03 NOTE — Telephone Encounter (Signed)
Pt called stating that he needs a work letter faxed: [employer] @ [phone].  Pt did contact Dr. Ashley Royalty upon initial COVID + result. Has been in quarantine since. Please send a letter with begin date 10/27/2019.

## 2019-11-03 NOTE — Telephone Encounter (Signed)
Patient has virtual with Dr Ashley Royalty last week concerning his positive COVID diagnosis. Left msg stating he is still feeling bad with shortness of breath and cough. Requesting an RX be sent in for inhaler and cough medication.   Please send RXs to [facility] on file.   Note to covering provider

## 2019-12-02 ENCOUNTER — Ambulatory Visit: Payer: Managed Care, Other (non HMO) | Admitting: Family Medicine

## 2019-12-10 ENCOUNTER — Encounter: Payer: Self-pay | Admitting: Family Medicine

## 2019-12-10 ENCOUNTER — Ambulatory Visit (INDEPENDENT_AMBULATORY_CARE_PROVIDER_SITE_OTHER): Payer: Managed Care, Other (non HMO) | Admitting: Family Medicine

## 2019-12-10 VITALS — BP 104/75 | HR 95 | Temp 97.9°F | Wt 180.1 lb

## 2019-12-10 DIAGNOSIS — I10 Essential (primary) hypertension: Secondary | ICD-10-CM

## 2019-12-10 DIAGNOSIS — Z23 Encounter for immunization: Secondary | ICD-10-CM

## 2019-12-10 DIAGNOSIS — M79644 Pain in right finger(s): Secondary | ICD-10-CM

## 2019-12-10 DIAGNOSIS — F3341 Major depressive disorder, recurrent, in partial remission: Secondary | ICD-10-CM

## 2019-12-10 DIAGNOSIS — M65311 Trigger thumb, right thumb: Secondary | ICD-10-CM | POA: Insufficient documentation

## 2019-12-10 MED ORDER — MELOXICAM 15 MG PO TABS: 15.0000 mg | ORAL_TABLET | Freq: Every day | ORAL | 0 refills | Status: DC

## 2019-12-10 NOTE — Progress Notes (Signed)
Danny Lyons. - 49 y.o. male MRN [id number]  Date of birth: [birth year]  Subjective No chief complaint on file.   HPI Danny Brogden. is a 49 y.o. male here today for follow up of depression.  He also had COVID in August, he has recovered well from this.     He was changed from citalopram to bupropion due to sexual side effects related to citalopram.  He is doing well with this change.  He denies side effects related to bupropion and feels good at current strength.    BP remains well controlled at this time as well.  Denies symptoms of hypotension.  He has not had chest pain, shortness of breath, palpitations, headache or vision changes.   He continues to have thumb pain and stiffness.  Had to cancel previous appt with Dr. Karie Schwalbe due to having COVID.  He has appt next week. Requesting something to help until next week.   Depression screen [facility] 2/9 10/07/2019 08/29/2018 07/10/2017  Decreased Interest 0 1 1  Down, Depressed, Hopeless 0 0 0  PHQ - 2 Score 0 1 1  Altered sleeping 1 1 1   Tired, decreased energy 0 0 1  Change in appetite 0 0 0  Feeling bad or failure about yourself  0 0 0  Trouble concentrating 0 0 0  Moving slowly or fidgety/restless 0 0 0  Suicidal thoughts 0 0 0  PHQ-9 Score 1 2 3   Difficult doing work/chores Not difficult at all Not difficult at all -   GAD 7 : Generalized Anxiety Score 10/07/2019 08/29/2018 07/10/2017 01/09/2017  Nervous, Anxious, on Edge 0 0 0 0  Control/stop worrying 1 0 0 0  Worry too much - different things 1 0 0 1  Trouble relaxing 0 0 0 2  Restless 0 0 0 0  Easily annoyed or irritable 0 0 0 0  Afraid - awful might happen 0 0 0 0  Total GAD 7 Score 2 0 0 3  Anxiety Difficulty - Not difficult at all - -     . ROS:  A comprehensive ROS was completed and negative except as noted per HPI  No Known Allergies  Past Medical History:  Diagnosis Date  . Anxiety   . Closed rib fracture 2017   right  . Collar bone fracture 2017  . Depression   .  Hypertension   . Insomnia   . Overweight   . Prediabetes   . Tobacco use     Past Surgical History:  Procedure Laterality Date  . NO PAST SURGERIES      Social History   Socioeconomic History  . Marital status: Significant Other    Spouse name: Not on file  . Number of children: Not on file  . Years of education: Not on file  . Highest education level: Not on file  Occupational History  . Not on file  Tobacco Use  . Smoking status: Current Every Day Smoker    Packs/day: 1.00    Years: 21.00    Pack years: 21.00    Types: Cigarettes  . Smokeless tobacco: Never Used  Vaping Use  . Vaping Use: Never used  Substance and Sexual Activity  . Alcohol use: Not Currently  . Drug use: No  . Sexual activity: Yes  Other Topics Concern  . Not on file  Social History Narrative  . Not on file   Social Determinants of Health   Financial Resource Strain:   .  Difficulty of Paying Living Expenses: Not on file  Food Insecurity:   . Worried About Programme researcher, broadcasting/film/video in the Last Year: Not on file  . Ran Out of Food in the Last Year: Not on file  Transportation Needs:   . Lack of Transportation (Medical): Not on file  . Lack of Transportation (Non-Medical): Not on file  Physical Activity:   . Days of Exercise per Week: Not on file  . Minutes of Exercise per Session: Not on file  Stress:   . Feeling of Stress : Not on file  Social Connections:   . Frequency of Communication with Friends and Family: Not on file  . Frequency of Social Gatherings with Friends and Family: Not on file  . Attends Religious Services: Not on file  . Active Member of Clubs or Organizations: Not on file  . Attends Banker Meetings: Not on file  . Marital Status: Not on file    Family History  Problem Relation Age of Onset  . Diabetes Mother   . Hypertension Mother   . Heart attack Mother   . Hypertension Father   . Diabetes Father   . Heart attack Father   . Heart attack Maternal  Grandmother   . Heart attack Maternal Grandfather   . Heart attack Paternal Grandmother   . Heart attack Paternal Grandfather     Health Maintenance  Topic Date Due  . Hepatitis C Screening  Never done  . HIV Screening  Never done  . COVID-19 Vaccine (1) 10/06/2020 (Originally 08[birth year]/1984)  . TETANUS/TDAP  11/23/2023  . INFLUENZA VACCINE  Completed     ----------------------------------------------------------------------------------------------------------------------------------------------------------------------------------------------------------------- Physical Exam BP 104/75 (BP Location: Left Arm, Patient Position: Sitting, Cuff Size: Normal)   Pulse 95   Temp 97.9 F (36.6 C)   Wt 180 lb 1.6 oz (81.7 kg)   SpO2 100%   BMI 27.30 kg/m   Physical Exam Constitutional:      Appearance: Normal appearance.  HENT:     Head: Normocephalic and atraumatic.  Eyes:     General: No scleral icterus. Cardiovascular:     Rate and Rhythm: Normal rate and regular rhythm.  Pulmonary:     Effort: Pulmonary effort is normal.     Breath sounds: Normal breath sounds.  Musculoskeletal:     Cervical back: Neck supple.  Neurological:     General: No focal deficit present.     Mental Status: He is alert.  Psychiatric:        Mood and Affect: Mood normal.        Behavior: Behavior normal.     ------------------------------------------------------------------------------------------------------------------------------------------------------------------------------------------------------------------- Assessment and Plan  Hypertension goal BP (blood pressure) < 130/80 Blood pressure is at goal at for age and co-morbidities.  I recommend continuation of current antihypertensives.  In addition they were instructed to follow a low sodium diet with regular exercise to help to maintain adequate control of blood pressure.    Recurrent major depressive disorder, in partial remission  (HCC) He is doing well with change to bupropion.  Continue at current dose.  F/u in 6 months.   Thumb pain Right thumb with stiffness and triggering.  Will add meloxicam until his visit with Dr. Karie Schwalbe next week.    Meds ordered this encounter  Medications  . meloxicam (MOBIC) 15 MG tablet    Sig: Take 1 tablet (15 mg total) by mouth daily.    Dispense:  30 tablet    Refill:  0  Return in about 6 months (around 06/08/2020) for HTN/Depression.     This visit occurred during the SARS-CoV-2 public health emergency.  Safety protocols were in place, including screening questions prior to the visit, additional usage of staff PPE, and extensive cleaning of exam room while observing appropriate contact time as indicated for disinfecting solutions.

## 2019-12-10 NOTE — Patient Instructions (Signed)
Continue bupropion for depression.  Try meloxicam for the thumb, keep appt with Dr. Karie Schwalbe.  See me again in 6 months.

## 2019-12-10 NOTE — Assessment & Plan Note (Signed)
He is doing well with change to bupropion.  Continue at current dose.  F/u in 6 months.

## 2019-12-10 NOTE — Assessment & Plan Note (Signed)
Blood pressure is at goal at for age and co-morbidities.  I recommend continuation of current antihypertensives.  In addition they were instructed to follow a low sodium diet with regular exercise to help to maintain adequate control of blood pressure.   

## 2019-12-10 NOTE — Assessment & Plan Note (Signed)
Right thumb with stiffness and triggering.  Will add meloxicam until his visit with Dr. Karie Schwalbe next week.

## 2019-12-16 ENCOUNTER — Encounter: Payer: Self-pay | Admitting: Sports Medicine

## 2019-12-16 ENCOUNTER — Ambulatory Visit (INDEPENDENT_AMBULATORY_CARE_PROVIDER_SITE_OTHER): Payer: Managed Care, Other (non HMO) | Admitting: Sports Medicine

## 2019-12-16 ENCOUNTER — Other Ambulatory Visit: Payer: Self-pay

## 2019-12-16 ENCOUNTER — Ambulatory Visit (INDEPENDENT_AMBULATORY_CARE_PROVIDER_SITE_OTHER): Payer: Managed Care, Other (non HMO)

## 2019-12-16 DIAGNOSIS — M79644 Pain in right finger(s): Secondary | ICD-10-CM | POA: Diagnosis not present

## 2019-12-16 DIAGNOSIS — M65311 Trigger thumb, right thumb: Secondary | ICD-10-CM

## 2019-12-16 NOTE — Progress Notes (Signed)
    Procedures performed today:    Procedure: Real-time Ultrasound Guided injection of the right flexor pollicis longus tendon sheath Device: Samsung HS60  Verbal informed consent obtained.  Time-out conducted.  Noted no overlying erythema, induration, or other signs of local infection.  Skin prepped in a sterile fashion.  Local anesthesia: Topical Ethyl chloride.  With sterile technique and under real time ultrasound guidance:  0.5 cc kenalog 40, 0.5 cc lidocaine injected easily Completed without difficulty  Pain immediately resolved suggesting accurate placement of the medication.  Advised to call if fevers/chills, erythema, induration, drainage, or persistent bleeding.  Images permanently stored and available for review in PACS.  Impression: Technically successful ultrasound guided injection.  Independent interpretation of notes and tests performed by another provider:   None.  Brief History, Exam, Impression, and Recommendations:    Trigger thumb, right thumb Right-sided trigger thumb, severe pain. He will do a few sessions of therapy. Injected today, return to see me in 1 month.     ___________________________________________ Ihor Austin. Benjamin Stain, M.D., ABFM., CAQSM. Primary Care and Sports Medicine [place] [facility]  Adjunct Instructor of Family Medicine  [employer]

## 2019-12-16 NOTE — Assessment & Plan Note (Signed)
Right-sided trigger thumb, severe pain. He will do a few sessions of therapy. Injected today, return to see me in 1 month.

## 2020-01-13 ENCOUNTER — Ambulatory Visit: Payer: Managed Care, Other (non HMO) | Admitting: Sports Medicine

## 2020-03-01 ENCOUNTER — Emergency Department (INDEPENDENT_AMBULATORY_CARE_PROVIDER_SITE_OTHER): Payer: Managed Care, Other (non HMO)

## 2020-03-01 ENCOUNTER — Emergency Department
Admission: EM | Admit: 2020-03-01 | Discharge: 2020-03-01 | Disposition: A | Discharge status: Home / Self Care | Payer: Managed Care, Other (non HMO) | Source: Home / Self Care | Attending: Family Medicine | Admitting: Family Medicine

## 2020-03-01 ENCOUNTER — Other Ambulatory Visit: Payer: Self-pay

## 2020-03-01 DIAGNOSIS — M25561 Pain in right knee: Secondary | ICD-10-CM | POA: Diagnosis not present

## 2020-03-01 DIAGNOSIS — M25461 Effusion, right knee: Secondary | ICD-10-CM

## 2020-03-01 MED ORDER — METHYLPREDNISOLONE 4 MG PO TBPK: ORAL_TABLET | ORAL | 0 refills | Status: DC

## 2020-03-01 MED ORDER — OXYCODONE-ACETAMINOPHEN 5-325 MG PO TABS
2.0000 | ORAL_TABLET | ORAL | 0 refills | Status: DC | PRN
Start: 2020-03-01 — End: 2021-03-13

## 2020-03-01 MED ORDER — MELOXICAM 15 MG PO TABS: 15.0000 mg | ORAL_TABLET | Freq: Every day | ORAL | 0 refills | Status: DC

## 2020-03-01 NOTE — Discharge Instructions (Addendum)
Take the Medrol pack as directed.  Take all of day 1 today. This is a steroid anti-inflammatory.  It will help take down the pain and swelling Take oxycodone if needed in addition to the Medrol.  Do not drive on the oxycodone Try to stay off your leg. Use ice for 20 minutes every couple of hours Expect improvement over the next 2 to 3 days After you have finished the Medrol pack, go back on meloxicam once a day until knee is better

## 2020-03-01 NOTE — ED Provider Notes (Signed)
[facility]    CSN: [id number] Arrival date & time: 03/01/20  0919      History   Chief Complaint Chief Complaint  Patient presents with  . Joint Swelling    HPI Danny Lyons. is a 49 y.o. male.   HPI Patient states that he fell off the back of a truck, about 4 feet and scraped his right knee.  This was about 4 weeks ago.  He states that he had abrasions that healed.  He was immediately able to weight-bear.  He states that his knee was sore from the wounds, but he was able to continue working and continue walking.  Currently, his knee has gotten much worse.  Over the last 3 days he can hardly bear weight, has pain with movement of the knee.  He states that he was unable to sleep last night secondary to the pain and took Tylenol PM with no improvement.  No new fall or instability.  No prior knee problems Past Medical History:  Diagnosis Date  . Anxiety   . Closed rib fracture 2017   right  . Collar bone fracture 2017  . Depression   . Hypertension   . Insomnia   . Overweight   . Prediabetes   . Tobacco use     Patient Active Problem List   Diagnosis Date Noted  . Trigger thumb, right thumb 12/10/2019  . COVID-19 10/27/2019  . Well adult exam 10/07/2019  . Carpal tunnel syndrome, bilateral 09/25/2018  . Elevated ALT measurement 09/01/2018  . Prediabetes 09/01/2018  . Hypertriglyceridemia 09/01/2018  . Recurrent major depressive disorder, in partial remission (HCC) 07/10/2017  . Encounter for long-term (current) use of medications 07/10/2017  . Insomnia due to other mental disorder 01/09/2017  . Dyslipidemia, goal LDL below 100 01/05/2017  . Hypertension goal BP (blood pressure) < 130/80 11/09/2016  . Heavy tobacco smoker who smokes more than 10 cigarettes per day 11/09/2016  . Moderate episode of recurrent major depressive disorder (HCC) 11/09/2016  . Family history of heart attack 11/09/2016  . Elevated liver enzymes 11/23/2013  . Lactic acidosis  11/23/2013  . Dysthymic disorder 03/08/2009    Past Surgical History:  Procedure Laterality Date  . NO PAST SURGERIES         Home Medications    Prior to Admission medications   Medication Sig Start Date End Date Taking? Authorizing Provider  albuterol (VENTOLIN HFA) 108 (90 Base) MCG/ACT inhaler Inhale 2 puffs into the lungs every 6 (six) hours as needed for wheezing. 11/03/19   Christen Butter, NP  aspirin EC 81 MG tablet Take 1 tablet (81 mg total) by mouth daily. 01/09/17   Carlis Stable, PA-C  atorvastatin (LIPITOR) 40 MG tablet Take 1 tablet (40 mg total) by mouth daily. 10/07/19   Everrett Coombe, DO  buPROPion (WELLBUTRIN XL) 150 MG 24 hr tablet Take 1 tablet (150 mg total) by mouth daily. 10/07/19   Everrett Coombe, DO  Candesartan Cilexetil-HCTZ 32-25 MG TABS Take 1 tablet by mouth daily. 10/07/19   Everrett Coombe, DO  meloxicam (MOBIC) 15 MG tablet Take 1 tablet (15 mg total) by mouth daily. 03/01/20   Eustace Moore, MD  methylPREDNISolone (MEDROL DOSEPAK) 4 MG TBPK tablet tad 03/01/20   Eustace Moore, MD  oxyCODONE-acetaminophen (PERCOCET/ROXICET) 5-325 MG tablet Take 2 tablets by mouth every 4 (four) hours as needed for severe pain. 03/01/20   Eustace Moore, MD  omega-3 acid ethyl esters (LOVAZA) 1  g capsule Take 4 capsules (4 g total) by mouth daily. 10/07/19 03/01/20  Everrett Coombe, DO    Family History Family History  Problem Relation Age of Onset  . Diabetes Mother   . Hypertension Mother   . Heart attack Mother   . Hypertension Father   . Diabetes Father   . Heart attack Father   . Cancer Father   . Heart attack Maternal Grandmother   . Heart attack Maternal Grandfather   . Heart attack Paternal Grandmother   . Heart attack Paternal Grandfather     Social History Social History   Tobacco Use  . Smoking status: Current Every Day Smoker    Packs/day: 0.50    Years: 21.00    Pack years: 10.50    Types: Cigarettes  . Smokeless tobacco:  Never Used  Vaping Use  . Vaping Use: Never used  Substance Use Topics  . Alcohol use: Yes    Comment: occ  . Drug use: Yes    Types: Marijuana     Allergies   Patient has no known allergies.   Review of Systems Review of Systems See HPI  Physical Exam Triage Vital Signs ED Triage Vitals  Enc Vitals Group     BP 03/01/20 0936 113/76     Pulse Rate 03/01/20 0936 90     Resp 03/01/20 0936 15     Temp 03/01/20 0936 98.3 F (36.8 C)     Temp Source 03/01/20 0936 Oral     SpO2 03/01/20 0936 97 %     Weight --      Height --      Head Circumference --      Peak Flow --      Pain Score 03/01/20 0931 9     Pain Loc --      Pain Edu? --      Excl. in GC? --    No data found.  Updated Vital Signs BP 113/76 (BP Location: Right Arm)   Pulse 90   Temp 98.3 F (36.8 C) (Oral)   Resp 15   SpO2 97%     Physical Exam Constitutional:      General: He is not in acute distress.    Appearance: He is well-developed.  HENT:     Head: Normocephalic and atraumatic.  Eyes:     Conjunctiva/sclera: Conjunctivae normal.     Pupils: Pupils are equal, round, and reactive to light.  Cardiovascular:     Rate and Rhythm: Normal rate.  Pulmonary:     Effort: Pulmonary effort is normal. No respiratory distress.  Abdominal:     General: There is no distension.     Palpations: Abdomen is soft.  Musculoskeletal:        General: Normal range of motion.     Cervical back: Normal range of motion.  Skin:    General: Skin is warm and dry.  Neurological:     Mental Status: He is alert.     Gait: Gait abnormal.      UC Treatments / Results  Labs (all labs ordered are listed, but only abnormal results are displayed) Labs Reviewed - No data to display  EKG   Radiology DG Knee Complete 4 Views Right  Result Date: 03/01/2020 CLINICAL DATA:  Right knee pain after fall 1 month ago. EXAM: RIGHT KNEE - COMPLETE 4+ VIEW COMPARISON:  None. FINDINGS: No evidence of fracture or  dislocation. Small suprapatellar joint effusion is noted. No evidence of  arthropathy or other focal bone abnormality. Soft tissues are unremarkable. IMPRESSION: Small suprapatellar joint effusion. No fracture or dislocation is noted. Electronically Signed   By: Lupita Raider M.D.   On: 03/01/2020 10:10    Procedures Procedures (including critical care time)  Medications Ordered in UC Medications - No data to display  Initial Impression / Assessment and Plan / UC Course  I have reviewed the triage vital signs and the nursing notes.  Pertinent labs & imaging results that were available during my care of the patient were reviewed by me and considered in my medical decision making (see chart for details).     Patient has normal x-rays.  He has a knee effusion.  I discussed with him aspiration and injection.  He declines.  We will treat with prednisone, rest, Ace wrap, ice.  Expect improvement to 3 days.  Return as needed he is given a note to be out of work for the last week Final Clinical Impressions(s) / UC Diagnoses   Final diagnoses:  Pain and swelling of knee, right     Discharge Instructions     Take the Medrol pack as directed.  Take all of day 1 today. This is a steroid anti-inflammatory.  It will help take down the pain and swelling Take oxycodone if needed in addition to the Medrol.  Do not drive on the oxycodone Try to stay off your leg. Use ice for 20 minutes every couple of hours Expect improvement over the next 2 to 3 days After you have finished the Medrol pack, go back on meloxicam once a day until knee is better    ED Prescriptions    Medication Sig Dispense Auth. Provider   methylPREDNISolone (MEDROL DOSEPAK) 4 MG TBPK tablet tad 21 tablet Eustace Moore, MD   oxyCODONE-acetaminophen (PERCOCET/ROXICET) 5-325 MG tablet Take 2 tablets by mouth every 4 (four) hours as needed for severe pain. 15 tablet Eustace Moore, MD   meloxicam (MOBIC) 15 MG tablet  Take 1 tablet (15 mg total) by mouth daily. 30 tablet Eustace Moore, MD     I have reviewed the PDMP during this encounter.   Eustace Moore, MD 03/01/20 1039

## 2020-03-01 NOTE — ED Triage Notes (Signed)
Pt stated when he fell about a month ago it was off of a goose neck trailer about 4 feet off the ground.

## 2020-03-01 NOTE — ED Triage Notes (Signed)
Pt presents with R knee pain. Patient states he fell about a month ago  With some abrasions but no joint pain. abrasions healed. About 3 days ago the knee started swelling and was painful. Patient has taken otc pain medications with no improvement of symptoms. Pt also has tried ice and heat with no improvement.

## 2020-06-08 ENCOUNTER — Ambulatory Visit: Payer: Managed Care, Other (non HMO) | Admitting: Family Medicine

## 2020-11-18 ENCOUNTER — Other Ambulatory Visit: Payer: Self-pay

## 2020-11-18 ENCOUNTER — Encounter: Payer: Self-pay | Admitting: Family Medicine

## 2020-11-18 ENCOUNTER — Ambulatory Visit: Payer: Managed Care, Other (non HMO) | Admitting: Family Medicine

## 2020-11-18 VITALS — BP 138/81 | HR 98 | Ht 68.0 in | Wt 186.0 lb

## 2020-11-18 DIAGNOSIS — R21 Rash and other nonspecific skin eruption: Secondary | ICD-10-CM | POA: Diagnosis not present

## 2020-11-18 DIAGNOSIS — E785 Hyperlipidemia, unspecified: Secondary | ICD-10-CM

## 2020-11-18 DIAGNOSIS — I10 Essential (primary) hypertension: Secondary | ICD-10-CM | POA: Diagnosis not present

## 2020-11-18 MED ORDER — TRIAMCINOLONE ACETONIDE 0.1 % EX CREA: 1.0000 "application " | TOPICAL_CREAM | Freq: Two times a day (BID) | CUTANEOUS | 0 refills | Status: DC

## 2020-11-18 NOTE — Patient Instructions (Signed)
Try steroid cream to areas. Let me know if not improving with cream.

## 2020-11-19 LAB — COMPLETE METABOLIC PANEL WITH GFR
AG Ratio: 1.4 (calc) (ref 1.0–2.5)
ALT: 47 U/L — ABNORMAL HIGH (ref 9–46)
AST: 27 U/L (ref 10–40)
Albumin: 4.5 g/dL (ref 3.6–5.1)
Alkaline phosphatase (APISO): 102 U/L (ref 36–130)
BUN: 14 mg/dL (ref 7–25)
CO2: 29 mmol/L (ref 20–32)
Calcium: 9.9 mg/dL (ref 8.6–10.3)
Chloride: 100 mmol/L (ref 98–110)
Creat: 0.98 mg/dL (ref 0.60–1.29)
Globulin: 3.3 g/dL (calc) (ref 1.9–3.7)
Glucose, Bld: 93 mg/dL (ref 65–99)
Potassium: 4.3 mmol/L (ref 3.5–5.3)
Sodium: 138 mmol/L (ref 135–146)
Total Bilirubin: 0.6 mg/dL (ref 0.2–1.2)
Total Protein: 7.8 g/dL (ref 6.1–8.1)
eGFR: 95 mL/min/{1.73_m2} (ref >=60)

## 2020-11-19 LAB — CBC WITH DIFFERENTIAL/PLATELET
Absolute Monocytes: 682 cells/uL (ref 200–950)
Basophils Absolute: 77 cells/uL (ref 0–200)
Basophils Relative: 0.7 %
Eosinophils Absolute: 495 cells/uL (ref 15–500)
Eosinophils Relative: 4.5 %
HCT: 49.3 % (ref 38.5–50.0)
Hemoglobin: 17.4 g/dL — ABNORMAL HIGH (ref 13.2–17.1)
Lymphs Abs: 1870 cells/uL (ref 850–3900)
MCH: 33.8 pg — ABNORMAL HIGH (ref 27.0–33.0)
MCHC: 35.3 g/dL (ref 32.0–36.0)
MCV: 95.7 fL (ref 80.0–100.0)
MPV: 10.3 fL (ref 7.5–12.5)
Monocytes Relative: 6.2 %
Neutro Abs: 7876 cells/uL — ABNORMAL HIGH (ref 1500–7800)
Neutrophils Relative %: 71.6 %
Platelets: 227 10*3/uL (ref 140–400)
RBC: 5.15 10*6/uL (ref 4.20–5.80)
RDW: 12.7 % (ref 11.0–15.0)
Total Lymphocyte: 17 %
WBC: 11 10*3/uL — ABNORMAL HIGH (ref 3.8–10.8)

## 2020-11-19 LAB — LDL CHOLESTEROL, DIRECT: Direct LDL: 104 mg/dL — ABNORMAL HIGH (ref <100)

## 2020-11-19 LAB — SEDIMENTATION RATE: Sed Rate: 6 mm/h (ref 0–15)

## 2020-11-20 DIAGNOSIS — R21 Rash and other nonspecific skin eruption: Secondary | ICD-10-CM | POA: Insufficient documentation

## 2020-11-20 NOTE — Progress Notes (Signed)
Danny Lyons. - 50 y.o. male MRN [id number]  Date of birth: 17-Aug-1970  Subjective Chief Complaint  Patient presents with   Medication Refill    HPI Danny Lyons is a 50 year old male here today with complaint of rash.  Rash started a few weeks ago.  There are scattered, excoriated areas on the extremities.  Reports that some of these areas occasionally look like blisters.  He does not really have any pain with these.  He denies any new occupational exposures or new soaps at home.  He was wondering if it may be related to the medications.  He has not started any new medications recently.  He denies any fever, chills or flulike symptoms prior to onset.  He has tried Neosporin on some of these areas but has not really noted any improvement with this.  ROS:  A comprehensive ROS was completed and negative except as noted per HPI  No Known Allergies  Past Medical History:  Diagnosis Date   Anxiety    Closed rib fracture 2017   right   Collar bone fracture 2017   Depression    Hypertension    Insomnia    Overweight    Prediabetes    Tobacco use     Past Surgical History:  Procedure Laterality Date   NO PAST SURGERIES      Social History   Socioeconomic History   Marital status: Significant Other    Spouse name: Not on file   Number of children: Not on file   Years of education: Not on file   Highest education level: Not on file  Occupational History   Not on file  Tobacco Use   Smoking status: Every Day    Packs/day: 0.50    Years: 21.00    Pack years: 10.50    Types: Cigarettes   Smokeless tobacco: Never  Vaping Use   Vaping Use: Never used  Substance and Sexual Activity   Alcohol use: Yes    Comment: occ   Drug use: Yes    Types: Marijuana   Sexual activity: Yes  Other Topics Concern   Not on file  Social History Narrative   Not on file   Social Determinants of Health   Financial Resource Strain: Not on file  Food Insecurity: Not on file  Transportation  Needs: Not on file  Physical Activity: Not on file  Stress: Not on file  Social Connections: Not on file    Family History  Problem Relation Age of Onset   Diabetes Mother    Hypertension Mother    Heart attack Mother    Hypertension Father    Diabetes Father    Heart attack Father    Cancer Father    Heart attack Maternal Grandmother    Heart attack Maternal Grandfather    Heart attack Paternal Grandmother    Heart attack Paternal Grandfather     Health Maintenance  Topic Date Due   COVID-19 Vaccine (1) Never done   Pneumococcal Vaccine 19-67 Years old (1 - PCV) Never done   HIV Screening  Never done   Hepatitis C Screening  Never done   COLONOSCOPY (Pts 45-25yrs Insurance coverage will need to be confirmed)  Never done   INFLUENZA VACCINE  10/24/2020   TETANUS/TDAP  11/23/2023   HPV VACCINES  Aged Out     ----------------------------------------------------------------------------------------------------------------------------------------------------------------------------------------------------------------- Physical Exam BP 138/81 (BP Location: Left Arm, Patient Position: Sitting, Cuff Size: Normal)   Pulse 98   Ht 5'  8" (1.727 m)   Wt 186 lb (84.4 kg)   SpO2 100%   BMI 28.28 kg/m   Physical Exam Constitutional:      Appearance: Normal appearance.  Eyes:     General: No scleral icterus. Cardiovascular:     Rate and Rhythm: Normal rate and regular rhythm.  Pulmonary:     Effort: Pulmonary effort is normal.     Breath sounds: Normal breath sounds.  Musculoskeletal:     Cervical back: Neck supple.  Skin:    Comments: Scattered, raised, excoriated and lichenified areas along the arms, hands and legs.  Neurological:     General: No focal deficit present.     Mental Status: He is alert.  Psychiatric:        Mood and Affect: Mood normal.        Behavior: Behavior normal.     ------------------------------------------------------------------------------------------------------------------------------------------------------------------------------------------------------------------- Assessment and Plan  Hypertension goal BP (blood pressure) < 130/80 Blood pressure remains well controlled at this time.  I recommend continuation of current medication for management of hypertension.  Low-sodium diet stressed as well.  Updating CMP and CBC.  Dyslipidemia, goal LDL below 100 He is tolerating atorvastatin well.  Update lipid panel today.  Rash Unclear etiology.  Areas are slightly raised nodules I doubt condition such as monkey pox due to length of time he has had these as well as no prodromal symptoms prior to that.  We will try triamcinolone to these areas.  We discussed if not improving we can consider punch biopsy but he does not really like the idea of this.  Additional options include referral to dermatology.   Meds ordered this encounter  Medications   triamcinolone cream (KENALOG) 0.1 %    Sig: Apply 1 application topically 2 (two) times daily.    Dispense:  30 g    Refill:  0    No follow-ups on file.    This visit occurred during the SARS-CoV-2 public health emergency.  Safety protocols were in place, including screening questions prior to the visit, additional usage of staff PPE, and extensive cleaning of exam room while observing appropriate contact time as indicated for disinfecting solutions.

## 2020-11-20 NOTE — Assessment & Plan Note (Addendum)
Unclear etiology.  Areas are slightly raised nodules I doubt condition such as monkey pox due to length of time he has had these as well as no prodromal symptoms prior to that.  We will try triamcinolone to these areas.  We discussed if not improving we can consider punch biopsy but he does not really like the idea of this.  Additional options include referral to dermatology.

## 2020-11-20 NOTE — Assessment & Plan Note (Signed)
Blood pressure remains well controlled at this time.  I recommend continuation of current medication for management of hypertension.  Low-sodium diet stressed as well.  Updating CMP and CBC.

## 2020-11-20 NOTE — Assessment & Plan Note (Signed)
He is tolerating atorvastatin well.  Update lipid panel today. 

## 2020-11-22 ENCOUNTER — Other Ambulatory Visit: Payer: Self-pay

## 2020-11-22 DIAGNOSIS — I10 Essential (primary) hypertension: Secondary | ICD-10-CM

## 2020-11-23 ENCOUNTER — Other Ambulatory Visit: Payer: Self-pay

## 2020-11-23 DIAGNOSIS — E785 Hyperlipidemia, unspecified: Secondary | ICD-10-CM

## 2020-11-23 MED ORDER — ATORVASTATIN CALCIUM 40 MG PO TABS: 40.0000 mg | ORAL_TABLET | Freq: Every day | ORAL | 3 refills | Status: DC

## 2020-11-23 MED ORDER — MELOXICAM 15 MG PO TABS
15.0000 mg | ORAL_TABLET | Freq: Every day | ORAL | 0 refills | Status: DC
Start: 2020-11-23 — End: 2021-03-13

## 2020-11-23 MED ORDER — CANDESARTAN CILEXETIL-HCTZ 32-25 MG PO TABS: 1.0000 | ORAL_TABLET | Freq: Every day | ORAL | 3 refills | Status: DC

## 2020-11-23 MED ORDER — BUPROPION HCL ER (XL) 150 MG PO TB24
150.0000 mg | ORAL_TABLET | Freq: Every day | ORAL | 1 refills | Status: DC
Start: 2020-11-23 — End: 2021-10-06

## 2020-11-23 NOTE — Progress Notes (Signed)
Called pt with lab test results and he stated that he needed refills on his atorvastatin.  Refills sent to pharmacy.  Tiajuana Amass, CMA

## 2020-12-02 ENCOUNTER — Telehealth: Payer: Self-pay

## 2020-12-02 MED ORDER — CANDESARTAN CILEXETIL 32 MG PO TABS: 32.0000 mg | ORAL_TABLET | Freq: Every day | ORAL | 0 refills | Status: DC

## 2020-12-02 MED ORDER — HYDROCHLOROTHIAZIDE 25 MG PO TABS: 25.0000 mg | ORAL_TABLET | Freq: Every day | ORAL | 0 refills | Status: DC

## 2020-12-02 NOTE — Telephone Encounter (Signed)
Patient advised.

## 2020-12-02 NOTE — Telephone Encounter (Signed)
Danny Lyons called and left a message stating [facility] and other pharmacies can not get the Candesartan Cilexetil HCTZ. 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 He is wanting a different medication.   Dr Molli Hazard and Dr Lyn Hollingshead is out of the office.

## 2020-12-02 NOTE — Telephone Encounter (Signed)
I am going to temporarily separated into the 2 generics, candesartan 32 and hydrochlorothiazide 25, lets see if we can get it to him when separated.

## 2020-12-31 ENCOUNTER — Other Ambulatory Visit: Payer: Self-pay | Admitting: Sports Medicine

## 2021-01-01 NOTE — Telephone Encounter (Signed)
To PCP

## 2021-03-13 ENCOUNTER — Encounter: Payer: Self-pay | Admitting: Family Medicine

## 2021-03-13 ENCOUNTER — Other Ambulatory Visit: Payer: Self-pay

## 2021-03-13 ENCOUNTER — Telehealth (INDEPENDENT_AMBULATORY_CARE_PROVIDER_SITE_OTHER): Payer: Managed Care, Other (non HMO) | Admitting: Family Medicine

## 2021-03-13 DIAGNOSIS — M25561 Pain in right knee: Secondary | ICD-10-CM | POA: Diagnosis not present

## 2021-03-13 MED ORDER — MELOXICAM 15 MG PO TABS
15.0000 mg | ORAL_TABLET | Freq: Every day | ORAL | 0 refills | Status: DC
Start: 2021-03-13 — End: 2021-06-09

## 2021-03-13 MED ORDER — PREDNISONE 50 MG PO TABS: ORAL_TABLET | ORAL | 0 refills | Status: DC

## 2021-03-13 MED ORDER — SILDENAFIL CITRATE 100 MG PO TABS
50.0000 mg | ORAL_TABLET | Freq: Every day | ORAL | 3 refills | Status: DC | PRN
Start: 2021-03-13 — End: 2022-12-28

## 2021-03-13 NOTE — Progress Notes (Signed)
Danny Lyons. - 50 y.o. male MRN [id number]  Date of birth: Dec 03, 1970   This visit type was conducted due to national recommendations for restrictions regarding the COVID-19 Pandemic (e.g. social distancing).  This format is felt to be most appropriate for this patient at this time.  All issues noted in this document were discussed and addressed.  No physical exam was performed (except for noted visual exam findings with Video Visits).  I discussed the limitations of evaluation and management by telemedicine and the availability of in person appointments. The patient expressed understanding and agreed to proceed.  I connected withNAME@ on 03/13/21 at  2:30 PM EST by a video enabled telemedicine application and verified that I am speaking with the correct person using two identifiers.  Present at visit: Everrett Coombe, DO Danny Lyons.   Patient Location: [facility] [address] [place] [address]   Provider location:   [facility]  No chief complaint on file.   HPI  Danny Bail. is a 50 y.o. male who presents via audio/video conferencing for a telehealth visit today.  He has complaint today of right knee pain.  This started about a week ago.  He has had some swelling of the knee at initial onset.  He denies any injury.  His work is fairly physical.  Swelling has improved at this point but continues to have pain.  Pain is described as throbbing in nature.  He denies any redness but does have some warmth around the joint.  He has not tried anything other than topical muscle rub from his local pharmacy.   ROS:  A comprehensive ROS was completed and negative except as noted per HPI  Past Medical History:  Diagnosis Date   Anxiety    Closed rib fracture 2017   right   Collar bone fracture 2017   Depression    Hypertension    Insomnia    Overweight    Prediabetes    Tobacco use     Past Surgical History:  Procedure Laterality Date   NO PAST SURGERIES      Family History  Problem  Relation Age of Onset   Diabetes Mother    Hypertension Mother    Heart attack Mother    Hypertension Father    Diabetes Father    Heart attack Father    Cancer Father    Heart attack Maternal Grandmother    Heart attack Maternal Grandfather    Heart attack Paternal Grandmother    Heart attack Paternal Grandfather     Social History   Socioeconomic History   Marital status: Significant Other    Spouse name: Not on file   Number of children: Not on file   Years of education: Not on file   Highest education level: Not on file  Occupational History   Not on file  Tobacco Use   Smoking status: Every Day    Packs/day: 0.50    Years: 21.00    Pack years: 10.50    Types: Cigarettes   Smokeless tobacco: Never  Vaping Use   Vaping Use: Never used  Substance and Sexual Activity   Alcohol use: Yes    Comment: occ   Drug use: Yes    Types: Marijuana   Sexual activity: Yes  Other Topics Concern   Not on file  Social History Narrative   Not on file   Social Determinants of Health   Financial Resource Strain: Not on file  Food Insecurity: Not on file  Transportation Needs: Not on file  Physical Activity: Not on file  Stress: Not on file  Social Connections: Not on file  Intimate Partner Violence: Not on file     Current Outpatient Medications:    atorvastatin (LIPITOR) 40 MG tablet, Take 1 tablet (40 mg total) by mouth daily., Disp: 90 tablet, Rfl: 3   Candesartan Cilexetil-HCTZ 32-25 MG TABS, Take 1 tablet by mouth daily., Disp: 90 tablet, Rfl: 3   predniSONE (DELTASONE) 50 MG tablet, Take 1 tab po daily x5 days, Disp: 5 tablet, Rfl: 0   sildenafil (VIAGRA) 100 MG tablet, Take 0.5-1 tablets (50-100 mg total) by mouth daily as needed for erectile dysfunction., Disp: 10 tablet, Rfl: 3   triamcinolone cream (KENALOG) 0.1 %, Apply 1 application topically 2 (two) times daily., Disp: 30 g, Rfl: 0   buPROPion (WELLBUTRIN XL) 150 MG 24 hr tablet, Take 1 tablet (150 mg total)  by mouth daily. (Patient not taking: Reported on 03/13/2021), Disp: 90 tablet, Rfl: 1   meloxicam (MOBIC) 15 MG tablet, Take 1 tablet (15 mg total) by mouth daily., Disp: 30 tablet, Rfl: 0  EXAM:  VITALS per patient if applicable: Ht 5\' 8"  (1.727 m)    Wt 186 lb (84.4 kg)    BMI 28.28 kg/m   GENERAL: alert, oriented, appears well and in no acute distress  HEENT: atraumatic, conjunttiva clear, no obvious abnormalities on inspection of external nose and ears  NECK: normal movements of the head and neck  LUNGS: on inspection no signs of respiratory distress, breathing rate appears normal, no obvious gross SOB, gasping or wheezing  CV: no obvious cyanosis  MS: moves all visible extremities without noticeable abnormality  PSYCH/NEURO: pleasant and cooperative, no obvious depression or anxiety, speech and thought processing grossly intact  ASSESSMENT AND PLAN:  Discussed the following assessment and plan:  Right knee pain We discussed limited ability to evaluate me over virtual visit.  We will provide trial of prednisone and meloxicam renewed.  If not improving with this instructed him to come in for in person evaluation     I discussed the assessment and treatment plan with the patient. The patient was provided an opportunity to ask questions and all were answered. The patient agreed with the plan and demonstrated an understanding of the instructions.   The patient was advised to call back or seek an in-person evaluation if the symptoms worsen or if the condition fails to improve as anticipated.    , DO

## 2021-03-13 NOTE — Progress Notes (Signed)
Right knee swelling. Started last week. Was unable to put pressure on it. At night it throbs. Using knee brace.

## 2021-03-13 NOTE — Assessment & Plan Note (Signed)
We discussed limited ability to evaluate me over virtual visit.  We will provide trial of prednisone and meloxicam renewed.  If not improving with this instructed him to come in for in person evaluation

## 2021-06-04 ENCOUNTER — Other Ambulatory Visit: Payer: Self-pay | Admitting: Family Medicine

## 2021-06-06 NOTE — Telephone Encounter (Signed)
Pls contact pt to schedule 6 month follow-up.  ? ?Sending 30 day med refill. ?

## 2021-06-06 NOTE — Telephone Encounter (Signed)
I left patient a V. Mail to schedule a 6 mos. F/U as requested. ?

## 2021-06-09 ENCOUNTER — Other Ambulatory Visit: Payer: Self-pay | Admitting: Family Medicine

## 2021-07-03 ENCOUNTER — Other Ambulatory Visit: Payer: Self-pay | Admitting: Family Medicine

## 2021-07-03 NOTE — Telephone Encounter (Signed)
LVM for patient to call back to get appt scheduled. AM 

## 2021-07-03 NOTE — Telephone Encounter (Signed)
Pls contact patient for 50-month followup (past due) for medication refills. Sending 30 day meds ? ?Thanks ?

## 2021-07-17 ENCOUNTER — Other Ambulatory Visit: Payer: Self-pay | Admitting: Family Medicine

## 2021-10-04 ENCOUNTER — Other Ambulatory Visit: Payer: Self-pay | Admitting: Family Medicine

## 2021-10-04 NOTE — Telephone Encounter (Signed)
Pt has appt on 10/06/2021.

## 2021-10-06 ENCOUNTER — Encounter: Payer: Self-pay | Admitting: Family Medicine

## 2021-10-06 ENCOUNTER — Ambulatory Visit: Payer: Managed Care, Other (non HMO) | Admitting: Family Medicine

## 2021-10-06 VITALS — BP 144/81 | HR 76 | Ht 68.0 in | Wt 188.0 lb

## 2021-10-06 DIAGNOSIS — F331 Major depressive disorder, recurrent, moderate: Secondary | ICD-10-CM

## 2021-10-06 DIAGNOSIS — E785 Hyperlipidemia, unspecified: Secondary | ICD-10-CM | POA: Diagnosis not present

## 2021-10-06 DIAGNOSIS — I10 Essential (primary) hypertension: Secondary | ICD-10-CM | POA: Diagnosis not present

## 2021-10-06 DIAGNOSIS — R7303 Prediabetes: Secondary | ICD-10-CM | POA: Diagnosis not present

## 2021-10-06 DIAGNOSIS — F17210 Nicotine dependence, cigarettes, uncomplicated: Secondary | ICD-10-CM | POA: Diagnosis not present

## 2021-10-06 MED ORDER — VALSARTAN-HYDROCHLOROTHIAZIDE 320-25 MG PO TABS: 1.0000 | ORAL_TABLET | Freq: Every day | ORAL | 3 refills | Status: DC

## 2021-10-06 MED ORDER — AMLODIPINE BESYLATE 5 MG PO TABS: 5.0000 mg | ORAL_TABLET | Freq: Every day | ORAL | 1 refills | Status: DC

## 2021-10-06 NOTE — Assessment & Plan Note (Signed)
Not currently on bupropion.  Declines to restart at this time.

## 2021-10-06 NOTE — Progress Notes (Signed)
Danny Lyons. - 51 y.o. male MRN [id number]  Date of birth: 1971-03-04  Subjective Chief Complaint  Patient presents with   Hypertension    HPI Danny Lyons. Is a 51 y.o. male here today for follow up.  Doing well at this time. Continues on candesartan/hctz for management of HTN.  He has actually doubled his medications as his BP has been running high at home. Admits to increased stress recently due to death of his mother, father and sister being ill and murder of his nephew. He denies symptoms of HTN including chest pain, shortness of breath, palpitations, headache  or vision changes.  Continues to smoke.  No interest in quitting at this time.    Tolerating atorvastatin well.   No longer taking bupropion.  Feels that he is handling stress pretty well.  Doesn't want to add this back on at this time.    ROS:  A comprehensive ROS was completed and negative except as noted per HPI  No Known Allergies  Past Medical History:  Diagnosis Date   Anxiety    Closed rib fracture 2017   right   Collar bone fracture 2017   Depression    Hypertension    Insomnia    Overweight    Prediabetes    Tobacco use     Past Surgical History:  Procedure Laterality Date   NO PAST SURGERIES      Social History   Socioeconomic History   Marital status: Significant Other    Spouse name: Not on file   Number of children: Not on file   Years of education: Not on file   Highest education level: Not on file  Occupational History   Not on file  Tobacco Use   Smoking status: Every Day    Packs/day: 0.50    Years: 21.00    Total pack years: 10.50    Types: Cigarettes   Smokeless tobacco: Never  Vaping Use   Vaping Use: Never used  Substance and Sexual Activity   Alcohol use: Yes    Comment: occ   Drug use: Yes    Types: Marijuana   Sexual activity: Yes  Other Topics Concern   Not on file  Social History Narrative   Not on file   Social Determinants of Health   Financial  Resource Strain: Not on file  Food Insecurity: Not on file  Transportation Needs: Not on file  Physical Activity: Not on file  Stress: Not on file  Social Connections: Not on file    Family History  Problem Relation Age of Onset   Diabetes Mother    Hypertension Mother    Heart attack Mother    Hypertension Father    Diabetes Father    Heart attack Father    Cancer Father    Heart attack Maternal Grandmother    Heart attack Maternal Grandfather    Heart attack Paternal Grandmother    Heart attack Paternal Grandfather     Health Maintenance  Topic Date Due   COVID-19 Vaccine (1) 01/06/2022 (Originally 02/14/1971)   COLONOSCOPY (Pts 45-72yrs Insurance coverage will need to be confirmed)  10/07/2022 (Originally 08/14/2017)   Hepatitis C Screening  10/07/2022 (Originally 08/15/1990)   HIV Screening  10/07/2022 (Originally 08/15/1987)   INFLUENZA VACCINE  10/24/2021   TETANUS/TDAP  11/23/2023   HPV VACCINES  Aged Out     ----------------------------------------------------------------------------------------------------------------------------------------------------------------------------------------------------------------- Physical Exam BP (!) 144/81 (BP Location: Left Arm, Patient Position: Sitting, Cuff Size: Large)  Pulse 76   Ht 5\' 8"  (1.727 m)   Wt 188 lb (85.3 kg)   SpO2 99%   BMI 28.59 kg/m   Physical Exam Constitutional:      Appearance: Normal appearance.  Eyes:     General: No scleral icterus. Cardiovascular:     Rate and Rhythm: Normal rate and regular rhythm.  Pulmonary:     Effort: Pulmonary effort is normal.     Breath sounds: Normal breath sounds.  Musculoskeletal:     Cervical back: Neck supple.  Neurological:     General: No focal deficit present.     Mental Status: He is alert.  Psychiatric:        Mood and Affect: Mood normal.        Behavior: Behavior normal.      ------------------------------------------------------------------------------------------------------------------------------------------------------------------------------------------------------------------- Assessment and Plan  Hypertension goal BP (blood pressure) < 130/80 BP has been elevated at home and remains elevated in clinic.  Change to valsartan/HCTZ and adding amlodipine. Return in 2 weeks for nurse visit to recheck BP.  6 month f/u with me.  Updating labs today.   Dyslipidemia, goal LDL below 100 Doing well with atorvastatin.  Updating Lipid panel.   Heavy tobacco smoker who smokes more than 10 cigarettes per day Counseled on cessation.  Not interested in quitting at this time.   Moderate episode of recurrent major depressive disorder (HCC) Not currently on bupropion.  Declines to restart at this time.    Meds ordered this encounter  Medications   valsartan-hydrochlorothiazide (DIOVAN-HCT) 320-25 MG tablet    Sig: Take 1 tablet by mouth daily.    Dispense:  90 tablet    Refill:  3   amLODipine (NORVASC) 5 MG tablet    Sig: Take 1 tablet (5 mg total) by mouth daily.    Dispense:  90 tablet    Refill:  1    Return in about 6 months (around 04/08/2022) for HTN.    This visit occurred during the SARS-CoV-2 public health emergency.  Safety protocols were in place, including screening questions prior to the visit, additional usage of staff PPE, and extensive cleaning of exam room while observing appropriate contact time as indicated for disinfecting solutions.

## 2021-10-06 NOTE — Patient Instructions (Signed)
Stop Candesartan and HCTZ  Start valsartan/hctz 320/25mg  AND amlodipine 5mg  daily  Follow up in 2 weeks for nurse visit for BP check.

## 2021-10-06 NOTE — Assessment & Plan Note (Signed)
Doing well with atorvastatin.  Updating Lipid panel.

## 2021-10-06 NOTE — Assessment & Plan Note (Signed)
BP has been elevated at home and remains elevated in clinic.  Change to valsartan/HCTZ and adding amlodipine. Return in 2 weeks for nurse visit to recheck BP.  6 month f/u with me.  Updating labs today.

## 2021-10-06 NOTE — Assessment & Plan Note (Signed)
Counseled on cessation.  Not interested in quitting at this time.

## 2021-10-07 LAB — CBC WITH DIFFERENTIAL/PLATELET
Absolute Monocytes: 587 cells/uL (ref 200–950)
Basophils Absolute: 71 cells/uL (ref 0–200)
Basophils Relative: 0.8 %
Eosinophils Absolute: 543 cells/uL — ABNORMAL HIGH (ref 15–500)
Eosinophils Relative: 6.1 %
HCT: 47.4 % (ref 38.5–50.0)
Hemoglobin: 16.4 g/dL (ref 13.2–17.1)
Lymphs Abs: 1985 cells/uL (ref 850–3900)
MCH: 32.9 pg (ref 27.0–33.0)
MCHC: 34.6 g/dL (ref 32.0–36.0)
MCV: 95 fL (ref 80.0–100.0)
MPV: 10.1 fL (ref 7.5–12.5)
Monocytes Relative: 6.6 %
Neutro Abs: 5714 cells/uL (ref 1500–7800)
Neutrophils Relative %: 64.2 %
Platelets: 216 10*3/uL (ref 140–400)
RBC: 4.99 10*6/uL (ref 4.20–5.80)
RDW: 12.6 % (ref 11.0–15.0)
Total Lymphocyte: 22.3 %
WBC: 8.9 10*3/uL (ref 3.8–10.8)

## 2021-10-07 LAB — COMPLETE METABOLIC PANEL WITH GFR
AG Ratio: 1.5 (calc) (ref 1.0–2.5)
ALT: 50 U/L — ABNORMAL HIGH (ref 9–46)
AST: 30 U/L (ref 10–40)
Albumin: 4.6 g/dL (ref 3.6–5.1)
Alkaline phosphatase (APISO): 90 U/L (ref 36–130)
BUN: 11 mg/dL (ref 7–25)
CO2: 24 mmol/L (ref 20–32)
Calcium: 9.7 mg/dL (ref 8.6–10.3)
Chloride: 103 mmol/L (ref 98–110)
Creat: 0.91 mg/dL (ref 0.60–1.29)
Globulin: 3 g/dL (calc) (ref 1.9–3.7)
Glucose, Bld: 86 mg/dL (ref 65–99)
Potassium: 4.1 mmol/L (ref 3.5–5.3)
Sodium: 136 mmol/L (ref 135–146)
Total Bilirubin: 0.4 mg/dL (ref 0.2–1.2)
Total Protein: 7.6 g/dL (ref 6.1–8.1)
eGFR: 103 mL/min/{1.73_m2} (ref >=60)

## 2021-10-07 LAB — HEMOGLOBIN A1C
Hgb A1c MFr Bld: 5.6 % of total Hgb (ref <5.7)
Mean Plasma Glucose: 114 mg/dL
eAG (mmol/L): 6.3 mmol/L

## 2021-10-07 LAB — LIPID PANEL W/REFLEX DIRECT LDL
Cholesterol: 199 mg/dL (ref <200)
HDL: 34 mg/dL — ABNORMAL LOW (ref >=40)
Non-HDL Cholesterol (Calc): 165 mg/dL (calc) — ABNORMAL HIGH (ref <130)
Total CHOL/HDL Ratio: 5.9 (calc) — ABNORMAL HIGH (ref <5.0)
Triglycerides: 708 mg/dL — ABNORMAL HIGH (ref <150)

## 2021-10-07 LAB — TSH: TSH: 1.01 mIU/L (ref 0.40–4.50)

## 2021-10-07 LAB — DIRECT LDL: Direct LDL: 82 mg/dL (ref <100)

## 2021-10-11 ENCOUNTER — Telehealth: Payer: Self-pay

## 2021-10-11 NOTE — Telephone Encounter (Signed)
Sent via mychart

## 2021-10-11 NOTE — Telephone Encounter (Signed)
Pt lvm requesting lab results

## 2021-10-12 ENCOUNTER — Telehealth: Payer: Self-pay | Admitting: Family Medicine

## 2021-10-12 MED ORDER — ICOSAPENT ETHYL 1 G PO CAPS: 2.0000 g | ORAL_CAPSULE | Freq: Two times a day (BID) | ORAL | 3 refills | Status: DC

## 2021-10-12 NOTE — Telephone Encounter (Signed)
Rx sent in

## 2021-10-12 NOTE — Telephone Encounter (Signed)
Pt called.  I read him his test results.  He wants Dr. Ashley Royalty to add vascepa.

## 2021-10-20 ENCOUNTER — Ambulatory Visit (INDEPENDENT_AMBULATORY_CARE_PROVIDER_SITE_OTHER): Payer: Managed Care, Other (non HMO) | Admitting: Family Medicine

## 2021-10-20 VITALS — BP 121/75 | HR 105 | Temp 98.3°F | Ht 68.0 in | Wt 187.1 lb

## 2021-10-20 DIAGNOSIS — I10 Essential (primary) hypertension: Secondary | ICD-10-CM | POA: Diagnosis not present

## 2021-10-20 NOTE — Progress Notes (Signed)
Medical screening examination/treatment was performed by qualified clinical staff member and as supervising physician I was immediately available for consultation/collaboration. I have reviewed documentation and agree with assessment and plan.  Harkirat Orozco, DO  

## 2021-10-20 NOTE — Progress Notes (Signed)
Patient is here for blood pressure check. Denies trouble sleeping, palpitations, dizziness, lightheadedness, blurry vision, chest pain, shortness of breath, headaches and/or medication problems.   Patient's blood pressure was within goal range. No missed dose. Patient states that he continues to have back pain, which may be increasing his blood pressure while  at home. The back pain is becoming unbearable. Patient is requesting a refill on hydrocodone. Rx not listed in active med list.   Patient informed to schedule next appointment as informed by provider.

## 2021-10-22 IMAGING — DX DG KNEE COMPLETE 4+V*R*
4 series · 4 of 4 positions shown · non-contrast
Comparison: None.

CLINICAL DATA: Right knee pain after fall 1 month ago.

EXAM:
RIGHT KNEE - COMPLETE 4+ VIEW

[knee ap]
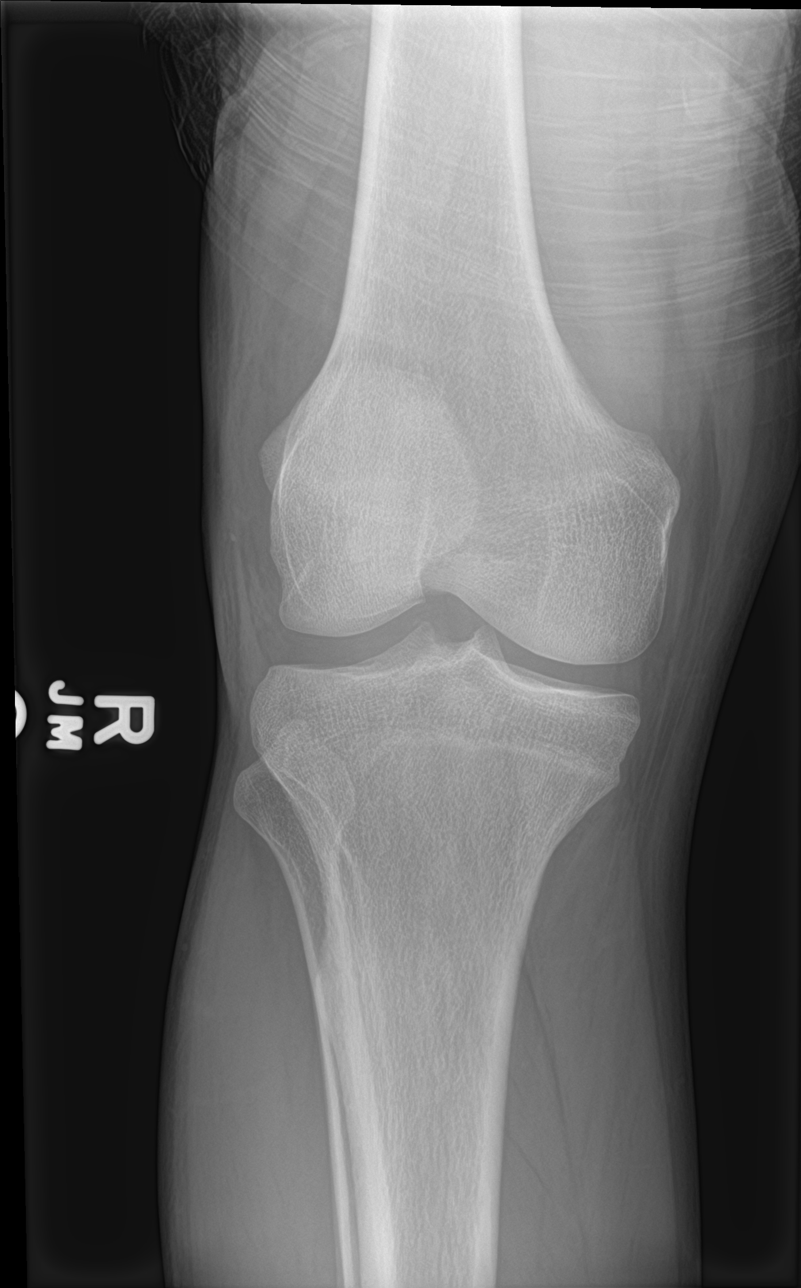

[knee lat]
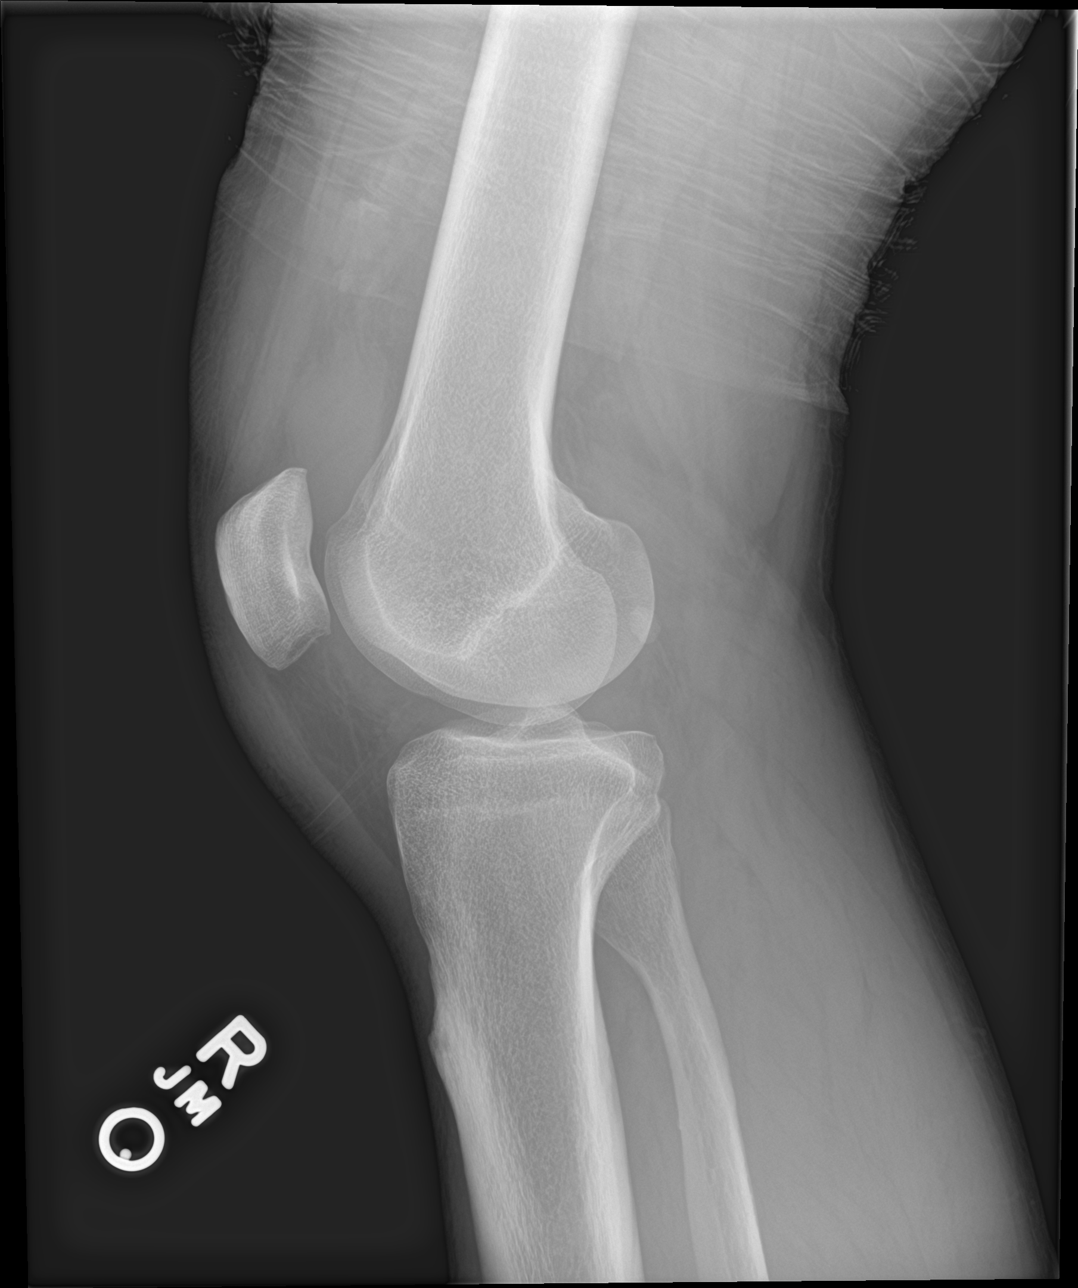

[knee obl (1 of 2)]
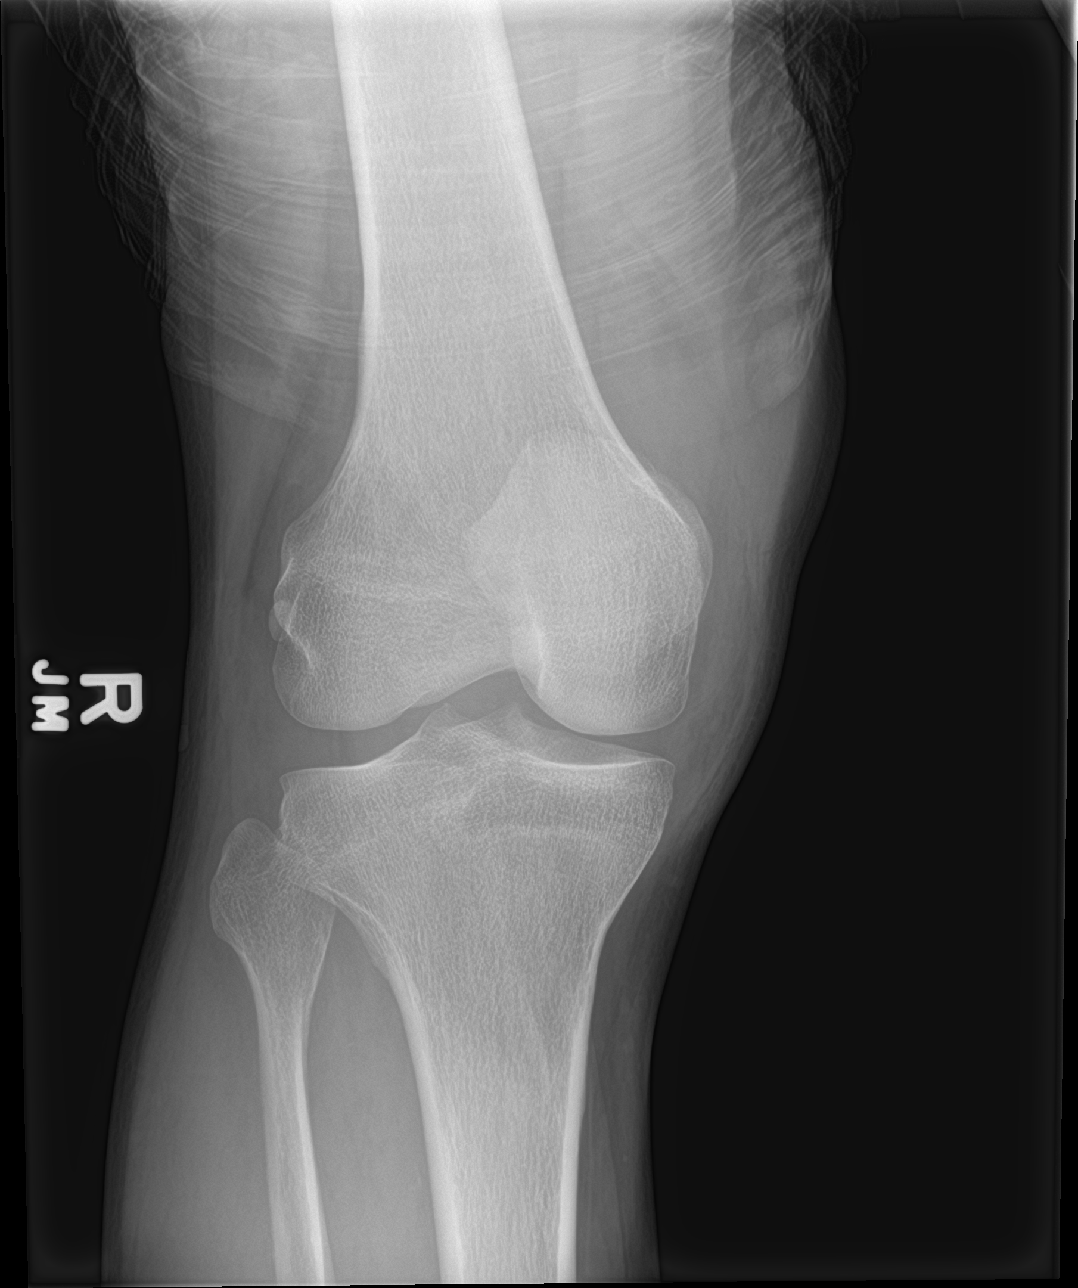

[knee obl (2 of 2)]
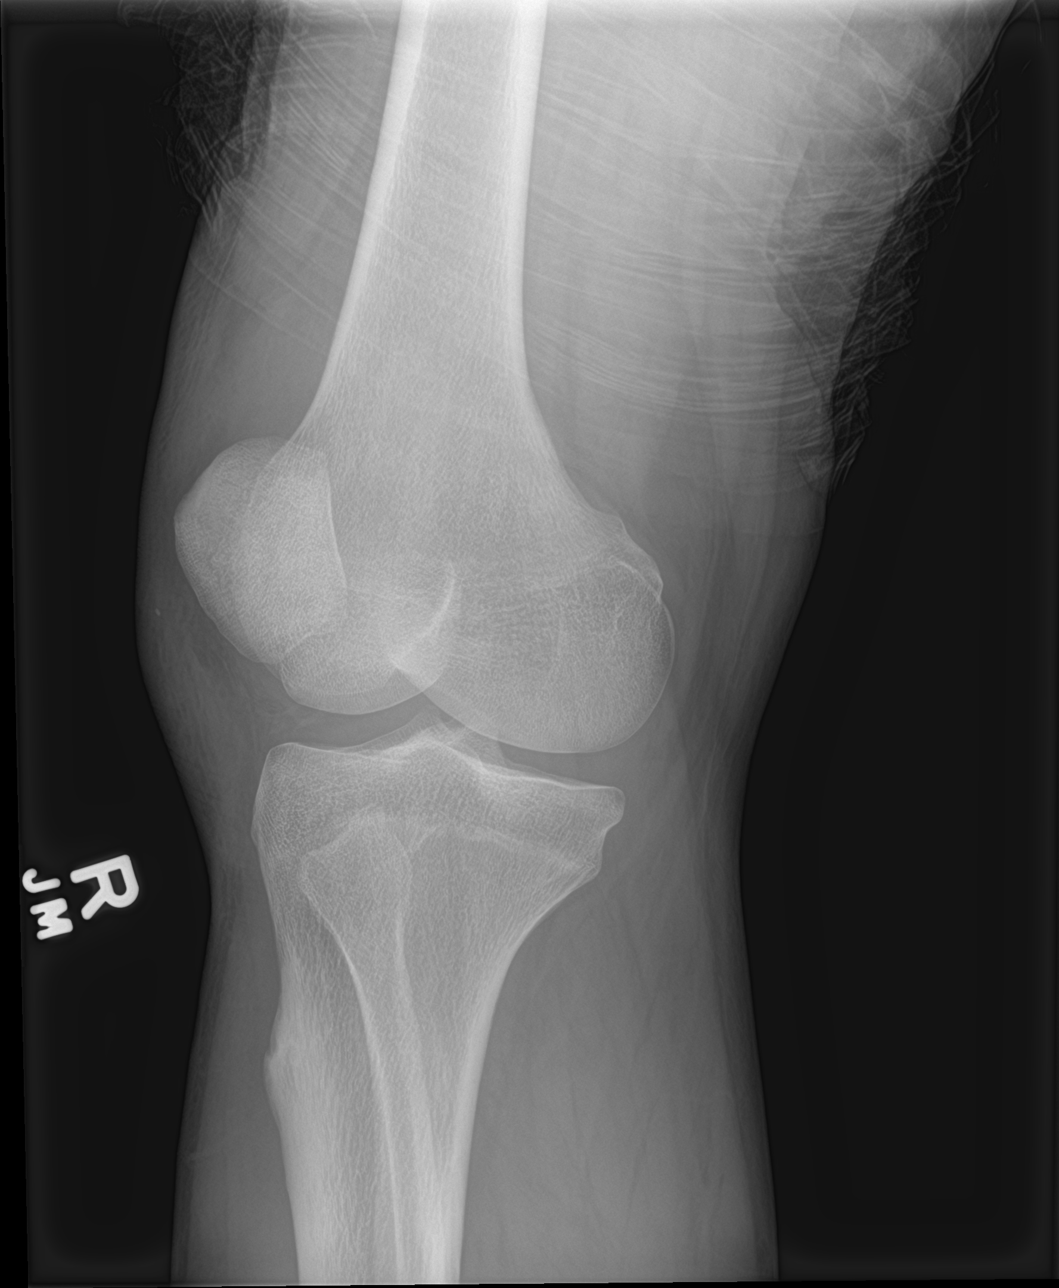

[4 of 4 positions shown; findings below may reference images not displayed]

FINDINGS: No evidence of fracture or dislocation. Small suprapatellar joint
effusion is noted. No evidence of arthropathy or other focal bone
abnormality. Soft tissues are unremarkable.
IMPRESSION: Small suprapatellar joint effusion. No fracture or dislocation is
noted.

## 2021-11-28 ENCOUNTER — Other Ambulatory Visit: Payer: Self-pay | Admitting: Family Medicine

## 2021-12-03 ENCOUNTER — Other Ambulatory Visit: Payer: Self-pay | Admitting: Family Medicine

## 2021-12-03 DIAGNOSIS — E785 Hyperlipidemia, unspecified: Secondary | ICD-10-CM

## 2021-12-08 ENCOUNTER — Ambulatory Visit: Payer: Managed Care, Other (non HMO) | Admitting: Sports Medicine

## 2021-12-08 ENCOUNTER — Ambulatory Visit (INDEPENDENT_AMBULATORY_CARE_PROVIDER_SITE_OTHER): Payer: Managed Care, Other (non HMO)

## 2021-12-08 DIAGNOSIS — M65311 Trigger thumb, right thumb: Secondary | ICD-10-CM | POA: Diagnosis not present

## 2021-12-08 DIAGNOSIS — M65312 Trigger thumb, left thumb: Secondary | ICD-10-CM

## 2021-12-08 NOTE — Progress Notes (Signed)
    Procedures performed today:    Procedure: Real-time Ultrasound Guided injection of the left flexor pollicis longus tendon sheath Device: Samsung HS60  Verbal informed consent obtained.  Time-out conducted.  Noted no overlying erythema, induration, or other signs of local infection.  Skin prepped in a sterile fashion.  Local anesthesia: Topical Ethyl chloride.  With sterile technique and under real time ultrasound guidance: Noted enlarged tendon nodule and mild sheath effusion, 1/2 cc lidocaine, 1/2 cc kenalog 40 injected easily. Completed without difficulty  Advised to call if fevers/chills, erythema, induration, drainage, or persistent bleeding.  Images permanently stored and available for review in PACS.  Impression: Technically successful ultrasound guided injection.  Independent interpretation of notes and tests performed by another provider:   None.  Brief History, Exam, Impression, and Recommendations:    Trigger thumb of both hands History of right-sided trigger thumb last injected September 2021, this is doing well, having a recurrence of symptoms but on the left side, palpable flexor tendon nodule with the flexor pollicis longus, today we did a FPL injection on the left, return to see me as needed.    ____________________________________________ Ihor Austin. Benjamin Stain, M.D., ABFM., CAQSM., AME. Primary Care and Sports Medicine [facility]  Adjunct Professor of Family Medicine  [employer]  Restaurant manager, fast food

## 2021-12-08 NOTE — Assessment & Plan Note (Signed)
History of right-sided trigger thumb last injected September 2021, this is doing well, having a recurrence of symptoms but on the left side, palpable flexor tendon nodule with the flexor pollicis longus, today we did a FPL injection on the left, return to see me as needed.

## 2022-02-03 ENCOUNTER — Other Ambulatory Visit: Payer: Self-pay | Admitting: Family Medicine

## 2022-02-20 ENCOUNTER — Telehealth: Payer: Self-pay

## 2022-02-20 MED ORDER — HYDROCORTISONE ACETATE 25 MG RE SUPP: 25.0000 mg | Freq: Two times a day (BID) | RECTAL | 0 refills | Status: DC

## 2022-02-20 NOTE — Telephone Encounter (Signed)
Anusol sent in.

## 2022-02-20 NOTE — Telephone Encounter (Signed)
Pt lvm requesting hemorrhoid medication. States OTC meds not helping.

## 2022-02-21 NOTE — Telephone Encounter (Signed)
Pt has been advised of Rx prescription.

## 2022-03-28 ENCOUNTER — Other Ambulatory Visit: Payer: Self-pay | Admitting: Family Medicine

## 2022-04-09 ENCOUNTER — Encounter: Payer: Self-pay | Admitting: Family Medicine

## 2022-04-09 ENCOUNTER — Ambulatory Visit: Payer: Managed Care, Other (non HMO) | Admitting: Family Medicine

## 2022-04-09 VITALS — BP 125/79 | HR 108 | Ht 68.0 in | Wt 190.0 lb

## 2022-04-09 DIAGNOSIS — Z23 Encounter for immunization: Secondary | ICD-10-CM

## 2022-04-09 DIAGNOSIS — I10 Essential (primary) hypertension: Secondary | ICD-10-CM

## 2022-04-09 DIAGNOSIS — R7303 Prediabetes: Secondary | ICD-10-CM | POA: Diagnosis not present

## 2022-04-09 DIAGNOSIS — E785 Hyperlipidemia, unspecified: Secondary | ICD-10-CM

## 2022-04-09 NOTE — Assessment & Plan Note (Signed)
Compliant with atorvastatin and vascepa.  Updating fasting lipids today.

## 2022-04-09 NOTE — Progress Notes (Signed)
Danny Lyons. - 52 y.o. male MRN [id number]  Date of birth: Apr 16, 1970  Subjective Chief Complaint  Patient presents with   Hypertension    HPI Danny Lyons. Is a 52 y.o. male here today for follow up visit.   He reports that he is doing well today.   BP medication changed from candesartan/hctz to valsartan/hctz at last visit.  Amlodipine was also added at previous visit.  He did start on valsartan/hctz briefly, however reports issues with stock at the pharmacy and has been taking candesartan with hctz over the past 4-5 months. He is tolerating current medications well.  He denies symptoms related to HTN at this time including chest pain, shortness of breath, palpitations, headache or vision changes. He does continue to smoke regularly.    He reports that he is doing well with combination of atorvastatin and vascepa for management of HLD.  He denies side effects related to medication.  He has not really made any significant changes to his diet.    ROS:  A comprehensive ROS was completed and negative except as noted per HPI    No Known Allergies  Past Medical History:  Diagnosis Date   Anxiety    Closed rib fracture 2017   right   Collar bone fracture 2017   Depression    Hypertension    Insomnia    Overweight    Prediabetes    Tobacco use     Past Surgical History:  Procedure Laterality Date   NO PAST SURGERIES      Social History   Socioeconomic History   Marital status: Significant Other    Spouse name: Not on file   Number of children: Not on file   Years of education: Not on file   Highest education level: Not on file  Occupational History   Not on file  Tobacco Use   Smoking status: Every Day    Packs/day: 0.50    Years: 21.00    Total pack years: 10.50    Types: Cigarettes   Smokeless tobacco: Never  Vaping Use   Vaping Use: Never used  Substance and Sexual Activity   Alcohol use: Yes    Comment: occ   Drug use: Yes    Types: Marijuana    Sexual activity: Yes  Other Topics Concern   Not on file  Social History Narrative   Not on file   Social Determinants of Health   Financial Resource Strain: Not on file  Food Insecurity: Not on file  Transportation Needs: Not on file  Physical Activity: Not on file  Stress: Not on file  Social Connections: Not on file    Family History  Problem Relation Age of Onset   Diabetes Mother    Hypertension Mother    Heart attack Mother    Hypertension Father    Diabetes Father    Heart attack Father    Cancer Father    Heart attack Maternal Grandmother    Heart attack Maternal Grandfather    Heart attack Paternal Grandmother    Heart attack Paternal Grandfather     Health Maintenance  Topic Date Due   INFLUENZA VACCINE  10/24/2021   COLONOSCOPY (Pts 45-23yrs Insurance coverage will need to be confirmed)  10/07/2022 (Originally 08/14/2017)   Hepatitis C Screening  10/07/2022 (Originally 08/15/1990)   HIV Screening  10/07/2022 (Originally 08/15/1987)   COVID-19 Vaccine (1) 04/26/2023 (Originally 08/14/1977)   DTaP/Tdap/Td (2 - Td or Tdap) 11/23/2023  HPV VACCINES  Aged Out     ----------------------------------------------------------------------------------------------------------------------------------------------------------------------------------------------------------------- Physical Exam BP 125/79   Pulse (!) 108   Ht 5\' 8"  (1.727 m)   Wt 190 lb (86.2 kg)   SpO2 98%   BMI 28.89 kg/m   Physical Exam Constitutional:      Appearance: Normal appearance.  HENT:     Head: Normocephalic and atraumatic.  Eyes:     General: No scleral icterus. Cardiovascular:     Rate and Rhythm: Normal rate and regular rhythm.  Pulmonary:     Effort: Pulmonary effort is normal.     Breath sounds: Normal breath sounds.  Musculoskeletal:     Cervical back: Neck supple.  Neurological:     Mental Status: He is alert.  Psychiatric:        Mood and Affect: Mood normal.         Behavior: Behavior normal.     ------------------------------------------------------------------------------------------------------------------------------------------------------------------------------------------------------------------- Assessment and Plan  Hypertension goal BP (blood pressure) < 130/80 BP is well controlled.  Continue candesartan/hctz and amlodipine at current strength.  Low sodium diet and avoidance of nicotine products recommended.    Dyslipidemia, goal LDL below 100 Compliant with atorvastatin and vascepa.  Updating fasting lipids today.   Prediabetes Update A1c.    No orders of the defined types were placed in this encounter.   Return in about 6 months (around 10/08/2022) for Annual exam/Fasting labs.    This visit occurred during the SARS-CoV-2 public health emergency.  Safety protocols were in place, including screening questions prior to the visit, additional usage of staff PPE, and extensive cleaning of exam room while observing appropriate contact time as indicated for disinfecting solutions.

## 2022-04-09 NOTE — Assessment & Plan Note (Signed)
Update A1c ?

## 2022-04-09 NOTE — Assessment & Plan Note (Signed)
BP is well controlled.  Continue candesartan/hctz and amlodipine at current strength.  Low sodium diet and avoidance of nicotine products recommended.

## 2022-04-10 LAB — HEMOGLOBIN A1C
Hgb A1c MFr Bld: 6.3 % of total Hgb — ABNORMAL HIGH (ref <5.7)
Mean Plasma Glucose: 134 mg/dL
eAG (mmol/L): 7.4 mmol/L

## 2022-04-10 LAB — COMPLETE METABOLIC PANEL WITH GFR
AG Ratio: 1.5 (calc) (ref 1.0–2.5)
ALT: 40 U/L (ref 9–46)
AST: 24 U/L (ref 10–40)
Albumin: 4.5 g/dL (ref 3.6–5.1)
Alkaline phosphatase (APISO): 115 U/L (ref 36–130)
BUN: 16 mg/dL (ref 7–25)
CO2: 23 mmol/L (ref 20–32)
Calcium: 10.4 mg/dL — ABNORMAL HIGH (ref 8.6–10.3)
Chloride: 99 mmol/L (ref 98–110)
Creat: 1.01 mg/dL (ref 0.60–1.29)
Globulin: 3.1 g/dL (calc) (ref 1.9–3.7)
Glucose, Bld: 141 mg/dL — ABNORMAL HIGH (ref 65–99)
Potassium: 4.5 mmol/L (ref 3.5–5.3)
Sodium: 134 mmol/L — ABNORMAL LOW (ref 135–146)
Total Bilirubin: 0.4 mg/dL (ref 0.2–1.2)
Total Protein: 7.6 g/dL (ref 6.1–8.1)
eGFR: 91 mL/min/{1.73_m2} (ref >=60)

## 2022-04-10 LAB — LIPID PANEL W/REFLEX DIRECT LDL
Cholesterol: 179 mg/dL (ref <200)
HDL: 40 mg/dL (ref >=40)
LDL Cholesterol (Calc): 100 mg/dL (calc) — ABNORMAL HIGH
Non-HDL Cholesterol (Calc): 139 mg/dL (calc) — ABNORMAL HIGH (ref <130)
Total CHOL/HDL Ratio: 4.5 (calc) (ref <5.0)
Triglycerides: 276 mg/dL — ABNORMAL HIGH (ref <150)

## 2022-04-23 ENCOUNTER — Telehealth: Payer: Self-pay

## 2022-04-23 NOTE — Telephone Encounter (Signed)
Pt lvm requesting lab results. Returned pt's call. Advised Dr. Zigmund Daniel sent a MyChart message and Levada Dy lvm for return call.   Lab results and recommendations given. Patient expressed understanding.

## 2022-07-03 ENCOUNTER — Other Ambulatory Visit: Payer: Self-pay | Admitting: Family Medicine

## 2022-10-08 ENCOUNTER — Encounter: Payer: Managed Care, Other (non HMO) | Admitting: Family Medicine

## 2022-12-04 ENCOUNTER — Other Ambulatory Visit: Payer: Self-pay | Admitting: Family Medicine

## 2022-12-05 NOTE — Telephone Encounter (Signed)
Pls contact pt to schedule HTN follow-up appt. Past due since July. Sending 30 day refill. Thx

## 2022-12-05 NOTE — Telephone Encounter (Signed)
Patient scheduled for 12/28/2022, thanks.

## 2022-12-06 ENCOUNTER — Other Ambulatory Visit: Payer: Self-pay | Admitting: Family Medicine

## 2022-12-12 ENCOUNTER — Ambulatory Visit: Payer: Managed Care, Other (non HMO) | Admitting: Sports Medicine

## 2022-12-12 ENCOUNTER — Other Ambulatory Visit (INDEPENDENT_AMBULATORY_CARE_PROVIDER_SITE_OTHER): Payer: Managed Care, Other (non HMO)

## 2022-12-12 ENCOUNTER — Encounter: Payer: Self-pay | Admitting: Sports Medicine

## 2022-12-12 DIAGNOSIS — M65311 Trigger thumb, right thumb: Secondary | ICD-10-CM | POA: Diagnosis not present

## 2022-12-12 DIAGNOSIS — M65312 Trigger thumb, left thumb: Secondary | ICD-10-CM | POA: Diagnosis not present

## 2022-12-12 NOTE — Progress Notes (Signed)
    Procedures performed today:    Procedure: Real-time Ultrasound Guided injection of the left flexor pollicis longus tendon sheath Device: Samsung HS60  Verbal informed consent obtained.  Time-out conducted.  Noted no overlying erythema, induration, or other signs of local infection.  Skin prepped in a sterile fashion.  Local anesthesia: Topical Ethyl chloride.  With sterile technique and under real time ultrasound guidance: Noted enlarged tendon nodule and mild sheath effusion, 1/2 cc lidocaine, 1/2 cc kenalog 40 injected easily. Completed without difficulty  Advised to call if fevers/chills, erythema, induration, drainage, or persistent bleeding.  Images permanently stored and available for review in PACS.  Impression: Technically successful ultrasound guided injection.  Independent interpretation of notes and tests performed by another provider:   None.  Brief History, Exam, Impression, and Recommendations:    Trigger thumb of both hands Recurrence of left sided trigger thumb, repeat left flexor pollicis longus tendon sheath injection, home conditioning given, return to see me as needed for this.    ____________________________________________ Ihor Austin. Benjamin Stain, M.D., ABFM., CAQSM., AME. Primary Care and Sports Medicine [place] [place] [facility]  Adjunct Professor of Family Medicine  [employer]  Restaurant manager, fast food

## 2022-12-12 NOTE — Assessment & Plan Note (Signed)
Recurrence of left sided trigger thumb, repeat left flexor pollicis longus tendon sheath injection, home conditioning given, return to see me as needed for this.

## 2022-12-28 ENCOUNTER — Encounter: Payer: Self-pay | Admitting: Family Medicine

## 2022-12-28 ENCOUNTER — Ambulatory Visit: Payer: Managed Care, Other (non HMO) | Admitting: Family Medicine

## 2022-12-28 VITALS — BP 126/81 | HR 92 | Ht 68.0 in | Wt 184.0 lb

## 2022-12-28 DIAGNOSIS — Z1211 Encounter for screening for malignant neoplasm of colon: Secondary | ICD-10-CM | POA: Diagnosis not present

## 2022-12-28 DIAGNOSIS — R7303 Prediabetes: Secondary | ICD-10-CM

## 2022-12-28 DIAGNOSIS — E785 Hyperlipidemia, unspecified: Secondary | ICD-10-CM | POA: Diagnosis not present

## 2022-12-28 DIAGNOSIS — I10 Essential (primary) hypertension: Secondary | ICD-10-CM | POA: Diagnosis not present

## 2022-12-28 DIAGNOSIS — M545 Other chronic pain: Secondary | ICD-10-CM | POA: Insufficient documentation

## 2022-12-28 DIAGNOSIS — F17210 Nicotine dependence, cigarettes, uncomplicated: Secondary | ICD-10-CM

## 2022-12-28 DIAGNOSIS — M25511 Pain in right shoulder: Secondary | ICD-10-CM

## 2022-12-28 DIAGNOSIS — G8929 Other chronic pain: Secondary | ICD-10-CM | POA: Insufficient documentation

## 2022-12-28 DIAGNOSIS — M19011 Primary osteoarthritis, right shoulder: Secondary | ICD-10-CM | POA: Insufficient documentation

## 2022-12-28 MED ORDER — CANDESARTAN CILEXETIL 32 MG PO TABS
32.0000 mg | ORAL_TABLET | Freq: Every day | ORAL | 1 refills | Status: DC
Start: 2022-12-28 — End: 2023-07-30

## 2022-12-28 MED ORDER — HYDROCHLOROTHIAZIDE 25 MG PO TABS
25.0000 mg | ORAL_TABLET | Freq: Every day | ORAL | 1 refills | Status: DC
Start: 2022-12-28 — End: 2023-07-30

## 2022-12-28 MED ORDER — SILDENAFIL CITRATE 100 MG PO TABS: 50.0000 mg | ORAL_TABLET | Freq: Every day | ORAL | 3 refills | Status: AC | PRN

## 2022-12-28 MED ORDER — MELOXICAM 15 MG PO TABS: 15.0000 mg | ORAL_TABLET | Freq: Every day | ORAL | 0 refills | Status: DC

## 2022-12-28 MED ORDER — AMLODIPINE BESYLATE 5 MG PO TABS
5.0000 mg | ORAL_TABLET | Freq: Every day | ORAL | 1 refills | Status: DC
Start: 2022-12-28 — End: 2023-07-30

## 2022-12-28 MED ORDER — TRAMADOL HCL 50 MG PO TABS: 50.0000 mg | ORAL_TABLET | Freq: Three times a day (TID) | ORAL | 0 refills | Status: AC | PRN

## 2022-12-28 MED ORDER — ICOSAPENT ETHYL 1 G PO CAPS: 2.0000 g | ORAL_CAPSULE | Freq: Two times a day (BID) | ORAL | 3 refills | Status: DC

## 2022-12-28 MED ORDER — CYCLOBENZAPRINE HCL 10 MG PO TABS: 10.0000 mg | ORAL_TABLET | Freq: Three times a day (TID) | ORAL | 0 refills | Status: AC | PRN

## 2022-12-28 MED ORDER — ATORVASTATIN CALCIUM 40 MG PO TABS
40.0000 mg | ORAL_TABLET | Freq: Every day | ORAL | 3 refills | Status: DC
Start: 2022-12-28 — End: 2023-07-30

## 2022-12-28 NOTE — Patient Instructions (Signed)
Take meloxicam daily Take flexeril as needed Use tramadol sparingly for severe pain.  Schedule with Dr. Karie Schwalbe.

## 2022-12-28 NOTE — Assessment & Plan Note (Signed)
Compliant with atorvastatin and vascepa.  Updating fasting lipids today.

## 2022-12-28 NOTE — Assessment & Plan Note (Signed)
Update A1c ?

## 2022-12-28 NOTE — Assessment & Plan Note (Signed)
Counseled on cessation.  Not interested in quitting at this time.

## 2022-12-28 NOTE — Progress Notes (Signed)
Danny Lyons. - 51 y.o. male MRN [id number]  Date of birth: Jul 20, 1970  Subjective Chief Complaint  Patient presents with   Hypertension   Shoulder Pain    Pt reports that he has R shoulder pain x 3 weeks. Denies any injury/trauma    HPI Danny Lyons. Is a 52 y.o. male   Complains of back pain, shoulder pain and continued thumb pain.  Seen by Dr. Benjamin Stain and had injection into thumb.  He reports that thumb continues to lock.  Shoulder pain recently started.  Does not recall any overuse or injury.  Decreased ROM, limitations with raising arm. He is requesting pain medication to help with this.    He continues on amlodipine, candesartan, hydrochlorothiazide for management of HTN.   BP is well controlled with medication at current strength.  Denies side effects.  Has not had chest pain, shortness of breath, palpitations, headache or vision changes.   Doing well with lipid medications.  Denies side effects at this time.  No significant changes to diet.   ROS:  A comprehensive ROS was completed and negative except as noted per HPI  No Known Allergies  Past Medical History:  Diagnosis Date   Anxiety    Closed rib fracture 2017   right   Collar bone fracture 2017   Depression    Hypertension    Insomnia    Overweight    Prediabetes    Tobacco use     Past Surgical History:  Procedure Laterality Date   NO PAST SURGERIES      Social History   Socioeconomic History   Marital status: Significant Other    Spouse name: Not on file   Number of children: Not on file   Years of education: Not on file   Highest education level: Not on file  Occupational History   Not on file  Tobacco Use   Smoking status: Every Day    Current packs/day: 0.50    Average packs/day: 0.5 packs/day for 21.0 years (10.5 ttl pk-yrs)    Types: Cigarettes   Smokeless tobacco: Never  Vaping Use   Vaping status: Never Used  Substance and Sexual Activity   Alcohol use: Yes    Comment: occ    Drug use: Yes    Types: Marijuana   Sexual activity: Yes  Other Topics Concern   Not on file  Social History Narrative   Not on file   Social Determinants of Health   Financial Resource Strain: Not on file  Food Insecurity: Not on file  Transportation Needs: Not on file  Physical Activity: Not on file  Stress: Not on file  Social Connections: Unknown (08/04/2021)   Received from [employer], [facility]   Social Network    Social Network: Not on file    Family History  Problem Relation Age of Onset   Diabetes Mother    Hypertension Mother    Heart attack Mother    Hypertension Father    Diabetes Father    Heart attack Father    Cancer Father    Heart attack Maternal Grandmother    Heart attack Maternal Grandfather    Heart attack Paternal Grandmother    Heart attack Paternal Grandfather     Health Maintenance  Topic Date Due   HIV Screening  Never done   Hepatitis C Screening  Never done   Colonoscopy  Never done   Zoster Vaccines- Shingrix (1 of 2) 03/30/2023 (Originally 08/15/1991)  COVID-19 Vaccine (1) 04/26/2023 (Originally 08/14/1977)   INFLUENZA VACCINE  06/24/2023 (Originally 10/25/2022)   DTaP/Tdap/Td (2 - Td or Tdap) 11/23/2023   HPV VACCINES  Aged Out     ----------------------------------------------------------------------------------------------------------------------------------------------------------------------------------------------------------------- Physical Exam BP 126/81   Pulse 92   Ht 5\' 8"  (1.727 m)   Wt 184 lb (83.5 kg)   SpO2 97%   BMI 27.98 kg/m   Physical Exam Constitutional:      Appearance: Normal appearance.  HENT:     Head: Normocephalic and atraumatic.  Cardiovascular:     Rate and Rhythm: Normal rate and regular rhythm.  Pulmonary:     Effort: Pulmonary effort is normal.     Breath sounds: Normal breath sounds.  Neurological:     General: No focal deficit present.     Mental Status: He is alert.  Psychiatric:         Mood and Affect: Mood normal.        Behavior: Behavior normal.     ------------------------------------------------------------------------------------------------------------------------------------------------------------------------------------------------------------------- Assessment and Plan  Hypertension goal BP (blood pressure) < 130/80 BP is well controlled.  Continue candesartan/hctz and amlodipine at current strength.  Low sodium diet and avoidance of nicotine products recommended.    Dyslipidemia, goal LDL below 100 Compliant with atorvastatin and vascepa.  Updating fasting lipids today.   Prediabetes Update A1c.   Chronic low back pain without sciatica Reports having work up for this in the past.  Rx for meloxicam and flexeril.  Tramadol as needed. He tells me "this will not work, this is bullshit, just refer me to a pain clinic".  Referral placed to pain management.    Heavy tobacco smoker who smokes more than 10 cigarettes per day Counseled on cessation.  Not interested in quitting at this time.   Right shoulder pain Offered subacromial injection today.  He declines, plans to see Dr. Benjamin Stain.    Meds ordered this encounter  Medications   hydrochlorothiazide (HYDRODIURIL) 25 MG tablet    Sig: Take 1 tablet (25 mg total) by mouth daily.    Dispense:  90 tablet    Refill:  1   amLODipine (NORVASC) 5 MG tablet    Sig: Take 1 tablet (5 mg total) by mouth daily.    Dispense:  90 tablet    Refill:  1   atorvastatin (LIPITOR) 40 MG tablet    Sig: Take 1 tablet (40 mg total) by mouth daily.    Dispense:  90 tablet    Refill:  3   candesartan (ATACAND) 32 MG tablet    Sig: Take 1 tablet (32 mg total) by mouth daily.    Dispense:  90 tablet    Refill:  1   icosapent Ethyl (VASCEPA) 1 g capsule    Sig: Take 2 capsules (2 g total) by mouth 2 (two) times daily.    Dispense:  120 capsule    Refill:  3   sildenafil (VIAGRA) 100 MG tablet    Sig: Take  0.5-1 tablets (50-100 mg total) by mouth daily as needed for erectile dysfunction.    Dispense:  10 tablet    Refill:  3   meloxicam (MOBIC) 15 MG tablet    Sig: Take 1 tablet (15 mg total) by mouth daily.    Dispense:  30 tablet    Refill:  0   cyclobenzaprine (FLEXERIL) 10 MG tablet    Sig: Take 1 tablet (10 mg total) by mouth 3 (three) times daily as needed for muscle spasms.  Dispense:  30 tablet    Refill:  0   traMADol (ULTRAM) 50 MG tablet    Sig: Take 1 tablet (50 mg total) by mouth every 8 (eight) hours as needed for up to 5 days for severe pain.    Dispense:  15 tablet    Refill:  0    Return in about 6 months (around 06/28/2023) for Hypertension.    This visit occurred during the SARS-CoV-2 public health emergency.  Safety protocols were in place, including screening questions prior to the visit, additional usage of staff PPE, and extensive cleaning of exam room while observing appropriate contact time as indicated for disinfecting solutions.

## 2022-12-28 NOTE — Assessment & Plan Note (Signed)
Offered subacromial injection today.  He declines, plans to see Dr. Benjamin Stain.

## 2022-12-28 NOTE — Assessment & Plan Note (Signed)
BP is well controlled.  Continue candesartan/hctz and amlodipine at current strength.  Low sodium diet and avoidance of nicotine products recommended.

## 2022-12-28 NOTE — Assessment & Plan Note (Signed)
Reports having work up for this in the past.  Rx for meloxicam and flexeril.  Tramadol as needed. He tells me "this will not work, this is bullshit, just refer me to a pain clinic".  Referral placed to pain management.

## 2022-12-29 LAB — CBC
Hematocrit: 51.5 % — ABNORMAL HIGH (ref 37.5–51.0)
Hemoglobin: 18 g/dL — ABNORMAL HIGH (ref 13.0–17.7)
MCH: 34.5 pg — ABNORMAL HIGH (ref 26.6–33.0)
MCHC: 35 g/dL (ref 31.5–35.7)
MCV: 99 fL — ABNORMAL HIGH (ref 79–97)
Platelets: 195 10*3/uL (ref 150–450)
RBC: 5.22 x10E6/uL (ref 4.14–5.80)
RDW: 13.4 % (ref 11.6–15.4)
WBC: 8.6 10*3/uL (ref 3.4–10.8)

## 2022-12-29 LAB — CMP14+EGFR
ALT: 56 [IU]/L — ABNORMAL HIGH (ref 0–44)
AST: 32 [IU]/L (ref 0–40)
Albumin: 4.8 g/dL (ref 4.1–5.1)
Alkaline Phosphatase: 132 [IU]/L — ABNORMAL HIGH (ref 44–121)
BUN/Creatinine Ratio: 11 (ref 9–20)
BUN: 10 mg/dL (ref 6–24)
Bilirubin Total: 0.6 mg/dL (ref 0.0–1.2)
CO2: 20 mmol/L (ref 20–29)
Calcium: 10.1 mg/dL (ref 8.7–10.2)
Chloride: 98 mmol/L (ref 96–106)
Creatinine, Ser: 0.89 mg/dL (ref 0.76–1.27)
Globulin, Total: 2.6 g/dL (ref 1.5–4.5)
Glucose: 101 mg/dL — ABNORMAL HIGH (ref 70–99)
Potassium: 3.9 mmol/L (ref 3.5–5.2)
Sodium: 138 mmol/L (ref 134–144)
Total Protein: 7.4 g/dL (ref 6.0–8.5)
eGFR: 104 mL/min/{1.73_m2} (ref >59)

## 2022-12-29 LAB — HEMOGLOBIN A1C
Est. average glucose Bld gHb Est-mCnc: 114 mg/dL
Hgb A1c MFr Bld: 5.6 % (ref 4.8–5.6)

## 2022-12-29 LAB — LIPID PANEL WITH LDL/HDL RATIO
Cholesterol, Total: 224 mg/dL — ABNORMAL HIGH (ref 100–199)
HDL: 34 mg/dL — ABNORMAL LOW (ref >39)
LDL Chol Calc (NIH): 117 mg/dL — ABNORMAL HIGH (ref 0–99)
LDL/HDL Ratio: 3.4 {ratio} (ref 0.0–3.6)
Triglycerides: 414 mg/dL — ABNORMAL HIGH (ref 0–149)
VLDL Cholesterol Cal: 73 mg/dL — ABNORMAL HIGH (ref 5–40)

## 2023-01-04 ENCOUNTER — Ambulatory Visit: Payer: Managed Care, Other (non HMO)

## 2023-01-04 ENCOUNTER — Encounter: Payer: Self-pay | Admitting: Sports Medicine

## 2023-01-04 ENCOUNTER — Ambulatory Visit: Payer: Managed Care, Other (non HMO) | Admitting: Sports Medicine

## 2023-01-04 DIAGNOSIS — M25511 Pain in right shoulder: Secondary | ICD-10-CM | POA: Diagnosis not present

## 2023-01-04 MED ORDER — PREDNISONE 50 MG PO TABS
ORAL_TABLET | ORAL | 0 refills | Status: DC
Start: 2023-01-04 — End: 2023-05-16

## 2023-01-04 MED ORDER — HYDROCODONE-ACETAMINOPHEN 5-325 MG PO TABS: 1.0000 | ORAL_TABLET | Freq: Three times a day (TID) | ORAL | 0 refills | Status: DC | PRN

## 2023-01-04 NOTE — Progress Notes (Signed)
    Procedures performed today:    None.  Independent interpretation of notes and tests performed by another provider:   None.  Brief History, Exam, Impression, and Recommendations:    Right shoulder pain Pleasant 52 year old male, was carrying a piece of plywood, felt a pop in the right shoulder and then subsequent pain, inability to abduct the shoulder. On exam he has significant pain to palpation, as well as pain and guarding to most movements. I am unable to get a good exam though he does have significant weakness to abduction. Due to the acuteness of the pain and weakness I think we need x-rays and an MRI, adding 5 days of steroids as well as 5 days of hydrocodone to control pain in the meantime, I am also going to write him out of work, he will likely need some short-term disability paperwork filled out, he understands he will need an appointment for that.    ____________________________________________ Ihor Austin. Benjamin Stain, M.D., ABFM., CAQSM., AME. Primary Care and Sports Medicine [facility]  Adjunct Professor of Family Medicine  [place] [employer]  Restaurant manager, fast food

## 2023-01-04 NOTE — Assessment & Plan Note (Signed)
Pleasant 52 year old male, was carrying a piece of plywood, felt a pop in the right shoulder and then subsequent pain, inability to abduct the shoulder. On exam he has significant pain to palpation, as well as pain and guarding to most movements. I am unable to get a good exam though he does have significant weakness to abduction. Due to the acuteness of the pain and weakness I think we need x-rays and an MRI, adding 5 days of steroids as well as 5 days of hydrocodone to control pain in the meantime, I am also going to write him out of work, he will likely need some short-term disability paperwork filled out, he understands he will need an appointment for that.

## 2023-01-09 ENCOUNTER — Telehealth: Payer: Self-pay | Admitting: Family Medicine

## 2023-01-09 NOTE — Telephone Encounter (Signed)
Patient came in requesting a new work note stating he can work with limitations. Patient would like to have the note printed out to be picked up.   [phone].

## 2023-01-09 NOTE — Telephone Encounter (Signed)
Per request, note written for him to be able to work as long as he has someone working with him.

## 2023-01-28 ENCOUNTER — Telehealth: Payer: Self-pay

## 2023-01-28 NOTE — Telephone Encounter (Signed)
Insurance denied the MRI right shoulder. Per insurance:  Patient has not completed a full 6 weeks of provider directed treatment. The provider directed treatment did not occur within the last three months.  Symptoms must be the same or worse after treatment to support imaging. There was no contact with your provider after completing treatment.  Provider can appeal at [phone]. Ref #[id number].

## 2023-01-29 NOTE — Telephone Encounter (Signed)
Approved, they are going to call back with the authorization number and fax the authorization but it is okay to schedule.

## 2023-01-29 NOTE — Telephone Encounter (Signed)
Update: Got the phone call for the approval: #[id number] Expires 07/15/2023

## 2023-01-29 NOTE — Telephone Encounter (Signed)
Noted. The MRI referral has been updated. The imaging department has been notified to contact patient for scheduling.

## 2023-02-09 ENCOUNTER — Ambulatory Visit: Payer: Managed Care, Other (non HMO)

## 2023-02-09 DIAGNOSIS — M25511 Pain in right shoulder: Secondary | ICD-10-CM | POA: Diagnosis not present

## 2023-02-09 DIAGNOSIS — G8929 Other chronic pain: Secondary | ICD-10-CM | POA: Diagnosis not present

## 2023-02-19 ENCOUNTER — Telehealth: Payer: Self-pay

## 2023-02-19 NOTE — Telephone Encounter (Signed)
Copied from CRM [id number]. Topic: Clinical - Lab/Test Results >> Feb 19, 2023  8:50 AM Amy B wrote: Reason for CRM: Patient requests call back to discuss MRI results, [phone].

## 2023-02-19 NOTE — Telephone Encounter (Signed)
I called and spoke with patient. I advised him [facility] is short staffed and it will take another week or 2 weeks to result.

## 2023-03-05 NOTE — Telephone Encounter (Signed)
Spoke to patient. He would like to discuss next steps with Dr. Karie Schwalbe. States the pain is not improving.      Copied from CRM [id number]. Topic: Clinical - Lab/Test Results >> Mar 01, 2023  5:01 PM Alona Bene A wrote: Reason for CRM: Patient returned phone call received from providers office. Patient is requesting call back. There were no notes on the chart regarding phone call. Patient stated the voicemail left mentioned MRI test results. Please contact patient at [phone].

## 2023-03-05 NOTE — Telephone Encounter (Signed)
Looks like I already resulted the MRI, it also looks like he already went to the pain clinic and got the injection.  The injection was subacromial so if he does not get sufficient relief we would need to try glenohumeral.

## 2023-03-18 ENCOUNTER — Ambulatory Visit: Payer: Managed Care, Other (non HMO) | Admitting: Sports Medicine

## 2023-03-18 ENCOUNTER — Other Ambulatory Visit (INDEPENDENT_AMBULATORY_CARE_PROVIDER_SITE_OTHER): Payer: Managed Care, Other (non HMO)

## 2023-03-18 ENCOUNTER — Encounter: Payer: Self-pay | Admitting: Sports Medicine

## 2023-03-18 DIAGNOSIS — M25511 Pain in right shoulder: Secondary | ICD-10-CM | POA: Diagnosis not present

## 2023-03-18 DIAGNOSIS — M19011 Primary osteoarthritis, right shoulder: Secondary | ICD-10-CM

## 2023-03-18 NOTE — Progress Notes (Signed)
    Procedures performed today:    Procedure: Real-time Ultrasound Guided injection of the right glenohumeral joint Device: Samsung HS60  Verbal informed consent obtained.  Time-out conducted.  Noted no overlying erythema, induration, or other signs of local infection.  Skin prepped in a sterile fashion.  Local anesthesia: Topical Ethyl chloride.  With sterile technique and under real time ultrasound guidance: Minimal arthritic changes noted 1 cc Kenalog 40, 2 cc lidocaine, 2 cc bupivacaine injected easily Completed without difficulty  Advised to call if fevers/chills, erythema, induration, drainage, or persistent bleeding.  Images permanently stored and available for review in PACS.  Impression: Technically successful ultrasound guided injection.  Independent interpretation of notes and tests performed by another provider:   None.  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 He went to his pain clinic, he got a subacromial injection without any relief, today we did a glenohumeral joint injection, I would like consultation with Dr. Everardo Pacific.    ____________________________________________ Danny Lyons. Benjamin Stain, M.D., ABFM., CAQSM., AME. Primary Care and Sports Medicine [facility]  Adjunct Professor of Family Medicine  [place] of [employer]  Restaurant manager, fast food

## 2023-03-18 NOTE — Assessment & Plan Note (Signed)
Pleasant 52 year old male, chronic right shoulder pain, ultimately MRI did show cuff tendinosis, glenohumeral osteoarthritis and a avascular necrosis with intense T2 edema in the humeral head. He went to his pain clinic, he got a subacromial injection without any relief, today we did a glenohumeral joint injection, I would like consultation with Dr. Everardo Pacific.

## 2023-03-21 DIAGNOSIS — M25511 Pain in right shoulder: Secondary | ICD-10-CM | POA: Diagnosis not present

## 2023-03-21 MED ORDER — TRIAMCINOLONE ACETONIDE 40 MG/ML IJ SUSP
40.0000 mg | Freq: Once | INTRAMUSCULAR | Status: AC
Start: 2023-03-21 — End: 2023-03-21
  Administered 2023-03-21: 40 mg via INTRAMUSCULAR

## 2023-03-21 NOTE — Addendum Note (Signed)
Addended by: Carren Rang A on: 03/21/2023 01:20 PM   Modules accepted: Orders

## 2023-03-21 NOTE — Code Documentation (Signed)
done

## 2023-03-25 ENCOUNTER — Other Ambulatory Visit: Payer: Self-pay | Admitting: Sports Medicine

## 2023-03-25 DIAGNOSIS — M25511 Pain in right shoulder: Secondary | ICD-10-CM

## 2023-03-25 MED ORDER — HYDROCODONE-ACETAMINOPHEN 5-325 MG PO TABS: 1.0000 | ORAL_TABLET | Freq: Three times a day (TID) | ORAL | 0 refills | Status: DC | PRN

## 2023-03-25 NOTE — Telephone Encounter (Signed)
Copied from CRM [id number]. Topic: Referral - Status >> Mar 25, 2023 10:26 AM Shelah Lewandowsky wrote: Reason for CRM: Patient asking about status on referral for surgeon.  Please call patient [phone]

## 2023-03-25 NOTE — Telephone Encounter (Signed)
Copied from CRM [id number]. Topic: Clinical - Medication Refill >> Mar 25, 2023 10:22 AM Shelah Lewandowsky wrote: Most Recent Primary Care Visit:  Provider: Monica Becton  Department: [facility]  Visit Type: OFFICE VISIT  Date: 03/18/2023  Medication: ***  Has the patient contacted their pharmacy?  (Agent: If no, request that the patient contact the pharmacy for the refill. If patient does not wish to contact the pharmacy document the reason why and proceed with request.) (Agent: If yes, when and what did the pharmacy advise?)  Is this the correct pharmacy for this prescription?  If no, delete pharmacy and type the correct one.  This is the patient's preferred pharmacy:  [id number] Lorenza Evangelist, [place] - [address] [place] [address] Phone: [phone] Fax: [phone]   Has the prescription been filled recently?   Is the patient out of the medication?   Has the patient been seen for an appointment in the last year OR does the patient have an upcoming appointment?   Can we respond through MyChart?   Agent: Please be advised that Rx refills may take up to 3 business days. We ask that you follow-up with your pharmacy.

## 2023-04-05 ENCOUNTER — Telehealth: Payer: Self-pay | Admitting: Sports Medicine

## 2023-04-05 NOTE — Telephone Encounter (Signed)
 Copied from CRM [id number]. Topic: Referral - Status >> Apr 04, 2023  2:02 PM Nila Nephew wrote: Reason for CRM: Referral for ortho needs to be re-sent to Dr. Everardo Pacific including the office notes. Requested it be sent with Attention: to Dr. Everardo Pacific.

## 2023-04-05 NOTE — Telephone Encounter (Signed)
 Referral, clinical notes, imaging reports, demographics and copies of insurance cards have been faxed to [facility] at [phone]. Office will contact patient to schedule referral appointment.

## 2023-04-18 ENCOUNTER — Other Ambulatory Visit: Payer: Self-pay | Admitting: Orthopaedic Surgery

## 2023-04-18 DIAGNOSIS — G8929 Other chronic pain: Secondary | ICD-10-CM

## 2023-05-06 ENCOUNTER — Ambulatory Visit
Admission: RE | Admit: 2023-05-06 | Discharge: 2023-05-06 | Disposition: A | Discharge status: Home / Self Care | Payer: Managed Care, Other (non HMO) | Source: Ambulatory Visit | Attending: Orthopaedic Surgery | Admitting: Orthopaedic Surgery

## 2023-05-06 DIAGNOSIS — G8929 Other chronic pain: Secondary | ICD-10-CM

## 2023-05-08 ENCOUNTER — Ambulatory Visit
Admission: EM | Admit: 2023-05-08 | Discharge: 2023-05-08 | Disposition: A | Discharge status: Home / Self Care | Payer: Managed Care, Other (non HMO)

## 2023-05-09 ENCOUNTER — Encounter (HOSPITAL_BASED_OUTPATIENT_CLINIC_OR_DEPARTMENT_OTHER): Payer: Self-pay | Admitting: Orthopaedic Surgery

## 2023-05-09 ENCOUNTER — Other Ambulatory Visit: Payer: Self-pay

## 2023-05-09 NOTE — Progress Notes (Signed)
   05/09/23 1001  Pre-op Phone Call  Surgery Date Verified 05/16/23  Arrival Time Verified  (Dr. Austin Miles office will call the day before surgery to pt will arrival time)  Surgery Location Verified [facility]  Medical History Reviewed Yes  Is the patient taking a GLP-1 receptor agonist? No  Does the patient have diabetes? No diagnosis of diabetes  Do you have a history of heart problems? No  Does patient have other implanted devices? No  Patient Teaching Enhanced Recovery;Pre / Post Procedure  Patient educated about smoking cessation 24 hours prior to surgery. (S)  Yes  Patient verbalizes understanding of bowel prep? N/A  THA/TKA patients only:  By your surgery date, will you have been taking narcotics for 90 days or greater? No  Med Rec Completed Yes  Take the Following Meds the Morning of Surgery Take noravasc  Recent  Lab Work, EKG, CXR? Yes  NPO (Including gum & candy) After midnight  Stop Solids, Milk, Candy, and Gum STARTING AT MIDNIGHT  Did patient view EMMI videos? No  Responsible adult to drive and be with you for 24 hours? Yes  Name & Phone Number for Ride/Caregiver Friend Sean  No Jewelry, money, nail polish or make-up.  No lotions, powders, perfumes. No shaving  48 hrs. prior to surgery. Yes  Contacts, Dentures & Glasses Will Have to be Removed Before OR. Yes  Please bring your ID and Insurance Card the morning of your surgery. (Surgery Centers Only) Yes  Bring any papers or x-rays with you that your surgeon gave you. Yes  Instructed to contact the location of procedure/ provider if they or anyone in their household develops symptoms or tests positive for COVID-19, has close contact with someone who tests positive for COVID, or has known exposure to any contagious illness. Yes  Call this number the morning of surgery  with any problems that may cancel your surgery. [phone]  Covid-19 Assessment  Have you had a positive COVID-19 test within the previous 90 days? No  COVID  Testing Guidance Proceed with the additional questions.  Patient's surgery required a COVID-19 test (cardiothoracic, complex ENT, and bronchoscopies/ EBUS) No  Have you been unmasked and in close contact with anyone with COVID-19 or COVID-19 symptoms within the past 10 days? No  Do you or anyone in your household currently have any COVID-19 symptoms? No

## 2023-05-13 NOTE — H&P (Signed)
 PREOPERATIVE H&P  Chief Complaint: right shoulder OA  HPI: Danny Nawrot. is a 53 y.o. male who is scheduled for Procedure(s): SHOULDER HEMIARTHROPLASTY.   Patient has a past medical history significant for HTN.   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  He had x-rays and an MRI.  He has had multiple injections.  He has a painful range of motion.  He has been on narcotics as well as NSAIDs to try and help.  He is unhappy with his shoulder function  Symptoms are rated as moderate to severe, and have been worsening.  This is significantly impairing activities of daily living.    Please see clinic note for further details on this patient's care.    He has elected for surgical management.   Past Medical History:  Diagnosis Date   Anxiety    Closed rib fracture 2017   right   Collar bone fracture 2017   Depression    Hypertension    Insomnia    Overweight    Prediabetes    Tobacco use    Past Surgical History:  Procedure Laterality Date   NO PAST SURGERIES     Social History   Socioeconomic History   Marital status: Significant Other    Spouse name: Not on file   Number of children: Not on file   Years of education: Not on file   Highest education level: Not on file  Occupational History   Not on file  Tobacco Use   Smoking status: Every Day    Current packs/day: 0.50    Average packs/day: 0.5 packs/day for 21.0 years (10.5 ttl pk-yrs)    Types: Cigarettes   Smokeless tobacco: Never  Vaping Use   Vaping status: Never Used  Substance and Sexual Activity   Alcohol use: Yes    Comment: occ   Drug use: Yes    Types: Marijuana   Sexual activity: Yes  Other Topics Concern   Not on file  Social History Narrative   Not on file   Social Drivers of Health   Financial Resource Strain: Not on file  Food Insecurity: Not on file  Transportation Needs: Not on file  Physical Activity: Not on file  Stress: Not on file   Social Connections: Unknown (08/04/2021)   Received from [facility]   Social Network    Social Network: Not on file   Family History  Problem Relation Age of Onset   Diabetes Mother    Hypertension Mother    Heart attack Mother    Hypertension Father    Diabetes Father    Heart attack Father    Cancer Father    Heart attack Maternal Grandmother    Heart attack Maternal Grandfather    Heart attack Paternal Grandmother    Heart attack Paternal Grandfather    No Known Allergies Prior to Admission medications   Medication Sig Start Date End Date Taking? Authorizing Provider  amLODipine (NORVASC) 5 MG tablet Take 1 tablet (5 mg total) by mouth daily. 12/28/22  Yes Everrett Coombe, DO  atorvastatin (LIPITOR) 40 MG tablet Take 1 tablet (40 mg total) by mouth daily. 12/28/22  Yes Everrett Coombe, DO  candesartan (ATACAND) 32 MG tablet Take 1 tablet (32 mg total) by mouth daily. 12/28/22  Yes Everrett Coombe, DO  hydrochlorothiazide (HYDRODIURIL) 25 MG tablet Take 1 tablet (25 mg total) by mouth daily. 12/28/22  Yes Everrett Coombe, DO  cyclobenzaprine (FLEXERIL) 10 MG tablet Take 1 tablet (10 mg total) by mouth 3 (three) times daily as needed for muscle spasms. 12/28/22   Everrett Coombe, DO  HYDROcodone-acetaminophen (NORCO/VICODIN) 5-325 MG tablet Take 1 tablet by mouth every 8 (eight) hours as needed for moderate pain (pain score 4-6). 03/25/23   Monica Becton, MD  icosapent Ethyl (VASCEPA) 1 g capsule Take 2 capsules (2 g total) by mouth 2 (two) times daily. 12/28/22   Everrett Coombe, DO  meloxicam (MOBIC) 15 MG tablet Take 1 tablet (15 mg total) by mouth daily. 12/28/22   Everrett Coombe, DO  predniSONE (DELTASONE) 50 MG tablet One tab PO daily for 5 days. 01/04/23   Monica Becton, MD  sildenafil (VIAGRA) 100 MG tablet Take 0.5-1 tablets (50-100 mg total) by mouth daily as needed for erectile dysfunction. 12/28/22   Everrett Coombe, DO  omega-3 acid ethyl esters  (LOVAZA) 1 g capsule Take 4 capsules (4 g total) by mouth daily. 10/07/19 03/01/20  Everrett Coombe, DO    ROS: All other systems have been reviewed and were otherwise negative with the exception of those mentioned in the HPI and as above.  Physical Exam: General: Alert, no acute distress Cardiovascular: No pedal edema Respiratory: No cyanosis, no use of accessory musculature GI: No organomegaly, abdomen is soft and non-tender Skin: No lesions in the area of chief complaint Neurologic: Sensation intact distally Psychiatric: Patient is competent for consent with normal mood and affect Lymphatic: No axillary or cervical lymphadenopathy  MUSCULOSKELETAL:  Active forward elevation to 80 degrees.  Significant crepitus with range of motion.  External rotation to 30 degrees.  Internal rotation to back pocket.  Cuff strength 5/5 but painful.  Imaging: MRI demonstrates an area of complete avascular necrosis in the humeral head with early collapse.  The cuff appears to be intact.  BMI: Body mass index is 27.37 kg/m.  Lab Results  Component Value Date   ALBUMIN 4.8 12/28/2022   Diabetes: Patient does not have a diagnosis of diabetes. Lab Results  Component Value Date   HGBA1C 5.6 12/28/2022     Smoking Status: Social History   Tobacco Use  Smoking Status Every Day   Current packs/day: 0.50   Average packs/day: 0.5 packs/day for 21.0 years (10.5 ttl pk-yrs)   Types: Cigarettes  Smokeless Tobacco Never   Ready to quit: Not Answered Counseling given: Not Answered  The patient has participated in a 4-week cessation program.           Assessment: right shoulder OA  Plan: Plan for Procedure(s): SHOULDER HEMIARTHROPLASTY   The risks benefits and alternatives were discussed with the patient including but not limited to the risks of nonoperative treatment, versus surgical intervention including infection, bleeding, nerve injury,  blood clots, cardiopulmonary complications,  morbidity, mortality, among others, and they were willing to proceed.   We additionally specifically discussed risks of axillary nerve injury, infection, periprosthetic fracture, continued pain and longevity of implants prior to beginning procedure.    Patient will be closely monitored in PACU for medical stabilization and pain control. If found stable in PACU, patient may be discharged home with outpatient follow-up. If any concerns regarding patient's stabilization patient will be admitted for observation after surgery. The patient is planning to be discharged home with outpatient PT.   The patient acknowledged the explanation, agreed to proceed with the plan and consent was signed.   He received operative clearance from his PCP, Dr. Everrett Coombe.   Operative Plan:  Right shoulder hemiarthroplasty Discharge Medications: standard DVT Prophylaxis: aspirin Physical Therapy: outpatient PT Special Discharge needs: Sling (should bring with him). IceMan   Vernetta Honey, PA-C  05/13/2023 3:07 PM

## 2023-05-14 ENCOUNTER — Encounter (HOSPITAL_BASED_OUTPATIENT_CLINIC_OR_DEPARTMENT_OTHER)
Admission: RE | Admit: 2023-05-14 | Discharge: 2023-05-14 | Disposition: A | Discharge status: Home / Self Care | Payer: Managed Care, Other (non HMO) | Source: Ambulatory Visit | Attending: Orthopaedic Surgery | Admitting: Orthopaedic Surgery

## 2023-05-14 DIAGNOSIS — Z01812 Encounter for preprocedural laboratory examination: Secondary | ICD-10-CM | POA: Diagnosis present

## 2023-05-15 NOTE — Discharge Instructions (Signed)
 Ramond Marrow MD, MPH Alfonse Alpers, PA-C [facility] [address], [address] [phone] (tel)   [phone] (fax)   POST-OPERATIVE INSTRUCTIONS - TOTAL SHOULDER REPLACEMENT    WOUND CARE You may leave the operative dressing in place until your follow-up appointment. KEEP THE INCISIONS CLEAN AND DRY. There may be a small amount of fluid/bleeding leaking at the surgical site. This is normal after surgery.  If it fills with liquid or blood please call us immediately to change it for you. Use the provided ice machine or Ice packs as often as possible for the first 3-4 days, then as needed for pain relief.   Keep a layer of cloth or a shirt between your skin and the cooling unit to prevent frost bite as it can get very cold.  SHOWERING: - You may shower on Post-Op Day #2.  - The dressing is water resistant but do not scrub it as it may start to peel up.   - You may remove the sling for showering - Gently pat the area dry.  - Do not soak the shoulder in water.  - Do not go swimming in the pool or ocean until your incision has completely healed (about 4-6 weeks after surgery) - KEEP THE INCISIONS CLEAN AND DRY.  EXERCISES Wear the sling at all times  You may remove the sling for showering, but keep the arm across the chest or in a secondary sling.    Accidental/Purposeful External Rotation and shoulder flexion (reaching behind you) is to be avoided at all costs for the first month. It is ok to come out of your sling if your are sitting and have assistance for eating.   Do not lift anything heavier than 1 pound until we discuss it further in clinic.  It is normal for your fingers/hand to become more swollen after surgery and discolored from bruising.   This will resolve over the first few weeks usually after surgery. Please continue to ambulate and do not stay sitting or lying for too long.  Perform foot and wrist pumps to assist in circulation.  PHYSICAL  THERAPY - You will begin physical therapy soon after surgery (unless otherwise specified) - Please call to set up an appointment, if you do not already have one  - Let our office if there are any issues with scheduling your therapy  - You have a physical therapy appointment scheduled at [facility] (across the hall from our office) on 2/24   REGIONAL ANESTHESIA (NERVE BLOCKS) The anesthesia team may have performed a nerve block for you this is a great tool used to minimize pain.   The block may start wearing off overnight (between 8-24 hours postop) When the block wears off, your pain may go from nearly zero to the pain you would have had postop without the block. This is an abrupt transition but nothing dangerous is happening.   This can be a challenging period but utilize your as needed pain medications to try and manage this period. We suggest you use the pain medication the first night prior to going to bed, to ease this transition.  You may take an extra dose of narcotic when this happens if needed   POST-OP MEDICATIONS- Multimodal approach to pain control In general your pain will be controlled with a combination of substances.  Prescriptions unless otherwise discussed are electronically sent to your pharmacy.  This is a carefully made plan we use to minimize narcotic use.  Celebrex - Anti-inflammatory medication taken on a scheduled basis Acetaminophen - Non-narcotic pain medicine taken on a scheduled basis  Oxycodone - This is a strong narcotic, to be used only on an "as needed" basis for SEVERE pain. Aspirin 81mg  - This medicine is used to minimize the risk of blood clots after surgery. Zofran -  take as needed for nausea   FOLLOW-UP If you develop a Fever (>101.5), Redness or Drainage from the surgical incision site, please call our office to arrange for an evaluation. Please call the office to schedule a follow-up appointment for a wound check, 7-10 days post-operatively.  IF  YOU HAVE ANY QUESTIONS, PLEASE FEEL FREE TO CALL OUR OFFICE.  HELPFUL INFORMATION  Your arm will be in a sling following surgery. You will be in this sling for the next 4 weeks.   You may be more comfortable sleeping in a semi-seated position the first few nights following surgery.  Keep a pillow propped under the elbow and forearm for comfort.  If you have a recliner type of chair it might be beneficial.  If not that is fine too, but it would be helpful to sleep propped up with pillows behind your operated shoulder as well under your elbow and forearm.  This will reduce pulling on the suture lines.  When dressing, put your operative arm in the sleeve first.  When getting undressed, take your operative arm out last.  Loose fitting, button-down shirts are recommended.  In most states it is against the law to drive while your arm is in a sling. And certainly against the law to drive while taking narcotics.  You may return to work/school in the next couple of days when you feel up to it. Desk work and typing in the sling is fine.  We suggest you use the pain medication the first night prior to going to bed, in order to ease any pain when the anesthesia wears off. You should avoid taking pain medications on an empty stomach as it will make you nauseous.  You should wean off your narcotic medicines as soon as you are able.     Most patients will be off narcotics before their first postop appointment.   Do not drink alcoholic beverages or take illicit drugs when taking pain medications.  Pain medication may make you constipated.  Below are a few solutions to try in this order: Decrease the amount of pain medication if you aren't having pain. Drink lots of decaffeinated fluids. Drink prune juice and/or each dried prunes  If the first 3 don't work start with additional solutions Take Colace - an over-the-counter stool softener Take Senokot - an over-the-counter laxative Take Miralax - a stronger  over-the-counter laxative   Dental Antibiotics:  In most cases prophylactic antibiotics for Dental procdeures after total joint surgery are not necessary.  Exceptions are as follows:  1. History of prior total joint infection  2. Severely immunocompromised (Organ Transplant, cancer chemotherapy, Rheumatoid biologic meds such as Humira)  3. Poorly controlled diabetes (A1C &gt; 8.0, blood glucose over 200)  If you have one of these conditions, contact your surgeon for an antibiotic prescription, prior to your dental procedure.   For more information including helpful videos and documents visit our website:   [email or web address]     TOOK tylenol at 6:30 am. No tylenol products till after 12pm today.    Regional Anesthesia Blocks  1. You may not be able to move or feel the "blocked" extremity after a  regional anesthetic block. This may last may last from 3-48 hours after placement, but it will go away. The length of time depends on the medication injected and your individual response to the medication. As the nerves start to wake up, you may experience tingling as the movement and feeling returns to your extremity. If the numbness and inability to move your extremity has not gone away after 48 hours, please call your surgeon.   2. The extremity that is blocked will need to be protected until the numbness is gone and the strength has returned. Because you cannot feel it, you will need to take extra care to avoid injury. Because it may be weak, you may have difficulty moving it or using it. You may not know what position it is in without looking at it while the block is in effect.  3. For blocks in the legs and feet, returning to weight bearing and walking needs to be done carefully. You will need to wait until the numbness is entirely gone and the strength has returned. You should be able to move your leg and foot normally before you try and bear weight  or walk. You will need someone to be with you when you first try to ensure you do not fall and possibly risk injury.  4. Bruising and tenderness at the needle site are common side effects and will resolve in a few days.  5. Persistent numbness or new problems with movement should be communicated to the surgeon or the [facility] [phone] [facility] [phone]).   Post Anesthesia Home Care Instructions  Activity: Get plenty of rest for the remainder of the day. A responsible individual must stay with you for 24 hours following the procedure.  For the next 24 hours, DO NOT: -Drive a car -Advertising copywriter -Drink alcoholic beverages -Take any medication unless instructed by your physician -Make any legal decisions or sign important papers.  Meals: Start with liquid foods such as gelatin or soup. Progress to regular foods as tolerated. Avoid greasy, spicy, heavy foods. If nausea and/or vomiting occur, drink only clear liquids until the nausea and/or vomiting subsides. Call your physician if vomiting continues.  Special Instructions/Symptoms: Your throat may feel dry or sore from the anesthesia or the breathing tube placed in your throat during surgery. If this causes discomfort, gargle with warm salt water. The discomfort should disappear within 24 hours.  If you had a scopolamine patch placed behind your ear for the management of post- operative nausea and/or vomiting:  1. The medication in the patch is effective for 72 hours, after which it should be removed.  Wrap patch in a tissue and discard in the trash. Wash hands thoroughly with soap and water. 2. You may remove the patch earlier than 72 hours if you experience unpleasant side effects which may include dry mouth, dizziness or visual disturbances. 3. Avoid touching the patch. Wash your hands with soap and water after contact with the patch.

## 2023-05-16 ENCOUNTER — Ambulatory Visit (HOSPITAL_BASED_OUTPATIENT_CLINIC_OR_DEPARTMENT_OTHER): Payer: Self-pay | Admitting: Certified Registered"

## 2023-05-16 ENCOUNTER — Ambulatory Visit (HOSPITAL_BASED_OUTPATIENT_CLINIC_OR_DEPARTMENT_OTHER): Payer: Managed Care, Other (non HMO) | Admitting: Certified Registered"

## 2023-05-16 ENCOUNTER — Ambulatory Visit (HOSPITAL_COMMUNITY): Payer: Managed Care, Other (non HMO)

## 2023-05-16 ENCOUNTER — Ambulatory Visit (HOSPITAL_BASED_OUTPATIENT_CLINIC_OR_DEPARTMENT_OTHER)
Admission: RE | Admit: 2023-05-16 | Discharge: 2023-05-16 | Disposition: A | Discharge status: Home / Self Care | Payer: Managed Care, Other (non HMO) | Attending: Orthopaedic Surgery | Admitting: Orthopaedic Surgery

## 2023-05-16 ENCOUNTER — Encounter (HOSPITAL_BASED_OUTPATIENT_CLINIC_OR_DEPARTMENT_OTHER): Payer: Self-pay | Admitting: Orthopaedic Surgery

## 2023-05-16 ENCOUNTER — Encounter (HOSPITAL_BASED_OUTPATIENT_CLINIC_OR_DEPARTMENT_OTHER)
Admission: RE | Disposition: A | Discharge status: Home / Self Care | Payer: Self-pay | Source: Home / Self Care | Attending: Orthopaedic Surgery

## 2023-05-16 ENCOUNTER — Other Ambulatory Visit: Payer: Self-pay

## 2023-05-16 DIAGNOSIS — Z79899 Other long term (current) drug therapy: Secondary | ICD-10-CM | POA: Diagnosis not present

## 2023-05-16 DIAGNOSIS — F17210 Nicotine dependence, cigarettes, uncomplicated: Secondary | ICD-10-CM | POA: Diagnosis not present

## 2023-05-16 DIAGNOSIS — M87011 Idiopathic aseptic necrosis of right shoulder: Secondary | ICD-10-CM | POA: Diagnosis not present

## 2023-05-16 DIAGNOSIS — I10 Essential (primary) hypertension: Secondary | ICD-10-CM | POA: Insufficient documentation

## 2023-05-16 DIAGNOSIS — M19011 Primary osteoarthritis, right shoulder: Secondary | ICD-10-CM | POA: Insufficient documentation

## 2023-05-16 DIAGNOSIS — Z01818 Encounter for other preprocedural examination: Secondary | ICD-10-CM

## 2023-05-16 DIAGNOSIS — M87811 Other osteonecrosis, right shoulder: Secondary | ICD-10-CM | POA: Insufficient documentation

## 2023-05-16 HISTORY — PX: SHOULDER HEMI-ARTHROPLASTY: SHX5049

## 2023-05-16 SURGERY — HEMIARTHROPLASTY, SHOULDER
Anesthesia: General | Site: Shoulder | Laterality: Right

## 2023-05-16 MED ORDER — PHENYLEPHRINE 80 MCG/ML (10ML) SYRINGE FOR IV PUSH (FOR BLOOD PRESSURE SUPPORT)
PREFILLED_SYRINGE | INTRAVENOUS | Status: AC
  Filled 2023-05-16: qty 10

## 2023-05-16 MED ORDER — BUPIVACAINE LIPOSOME 1.3 % IJ SUSP
INTRAMUSCULAR | Status: AC
Start: 2023-05-16 — End: ?
  Filled 2023-05-16: qty 10

## 2023-05-16 MED ORDER — LIDOCAINE 2% (20 MG/ML) 5 ML SYRINGE
INTRAMUSCULAR | Status: AC
  Filled 2023-05-16: qty 5

## 2023-05-16 MED ORDER — PHENYLEPHRINE HCL (PRESSORS) 10 MG/ML IV SOLN
INTRAVENOUS | Status: DC | PRN
  Administered 2023-05-16 (×2): 80 ug via INTRAVENOUS

## 2023-05-16 MED ORDER — AMISULPRIDE (ANTIEMETIC) 5 MG/2ML IV SOLN: 10.0000 mg | Freq: Once | INTRAVENOUS | Status: DC | PRN

## 2023-05-16 MED ORDER — ACETAMINOPHEN 500 MG PO TABS: 1000.0000 mg | ORAL_TABLET | Freq: Three times a day (TID) | ORAL | 0 refills | Status: AC

## 2023-05-16 MED ORDER — BUPIVACAINE LIPOSOME 1.3 % IJ SUSP
INTRAMUSCULAR | Status: DC | PRN
  Administered 2023-05-16: 10 mL via PERINEURAL

## 2023-05-16 MED ORDER — SODIUM CHLORIDE 0.9 % IR SOLN
Status: DC | PRN
Start: 2023-05-16 — End: 2023-05-16
  Administered 2023-05-16: 2000 mL

## 2023-05-16 MED ORDER — ONDANSETRON HCL 4 MG/2ML IJ SOLN: 4.0000 mg | Freq: Once | INTRAMUSCULAR | Status: DC | PRN

## 2023-05-16 MED ORDER — GABAPENTIN 300 MG PO CAPS
300.0000 mg | ORAL_CAPSULE | Freq: Once | ORAL | Status: AC
  Administered 2023-05-16: 300 mg via ORAL

## 2023-05-16 MED ORDER — ACETAMINOPHEN 500 MG PO TABS
1000.0000 mg | ORAL_TABLET | Freq: Once | ORAL | Status: AC
  Administered 2023-05-16: 1000 mg via ORAL

## 2023-05-16 MED ORDER — MIDAZOLAM HCL 2 MG/2ML IJ SOLN
2.0000 mg | Freq: Once | INTRAMUSCULAR | Status: AC
  Administered 2023-05-16: 2 mg via INTRAVENOUS

## 2023-05-16 MED ORDER — ACETAMINOPHEN 500 MG PO TABS: 1000.0000 mg | ORAL_TABLET | Freq: Once | ORAL | Status: DC

## 2023-05-16 MED ORDER — CEFAZOLIN SODIUM-DEXTROSE 2-4 GM/100ML-% IV SOLN
INTRAVENOUS | Status: AC
  Filled 2023-05-16: qty 100

## 2023-05-16 MED ORDER — PROPOFOL 10 MG/ML IV BOLUS
INTRAVENOUS | Status: DC | PRN
  Administered 2023-05-16: 150 mg via INTRAVENOUS

## 2023-05-16 MED ORDER — FENTANYL CITRATE (PF) 100 MCG/2ML IJ SOLN
INTRAMUSCULAR | Status: DC | PRN
  Administered 2023-05-16: 50 ug via INTRAVENOUS
  Administered 2023-05-16: 25 ug via INTRAVENOUS

## 2023-05-16 MED ORDER — ROCURONIUM BROMIDE 100 MG/10ML IV SOLN
INTRAVENOUS | Status: DC | PRN
  Administered 2023-05-16: 50 mg via INTRAVENOUS

## 2023-05-16 MED ORDER — ONDANSETRON HCL 4 MG/2ML IJ SOLN
INTRAMUSCULAR | Status: DC | PRN
  Administered 2023-05-16: 4 mg via INTRAVENOUS

## 2023-05-16 MED ORDER — DEXAMETHASONE SODIUM PHOSPHATE 10 MG/ML IJ SOLN
INTRAMUSCULAR | Status: DC | PRN
  Administered 2023-05-16: 10 mg via INTRAVENOUS

## 2023-05-16 MED ORDER — ROCURONIUM BROMIDE 10 MG/ML (PF) SYRINGE
PREFILLED_SYRINGE | INTRAVENOUS | Status: AC
  Filled 2023-05-16: qty 10

## 2023-05-16 MED ORDER — CEFAZOLIN SODIUM-DEXTROSE 2-4 GM/100ML-% IV SOLN
2.0000 g | INTRAVENOUS | Status: AC
  Administered 2023-05-16: 2 g via INTRAVENOUS

## 2023-05-16 MED ORDER — PHENYLEPHRINE HCL-NACL 20-0.9 MG/250ML-% IV SOLN
INTRAVENOUS | Status: DC | PRN
  Administered 2023-05-16: 25 ug/min via INTRAVENOUS

## 2023-05-16 MED ORDER — FENTANYL CITRATE (PF) 100 MCG/2ML IJ SOLN
100.0000 ug | Freq: Once | INTRAMUSCULAR | Status: AC
  Administered 2023-05-16: 100 ug via INTRAVENOUS

## 2023-05-16 MED ORDER — BUPIVACAINE HCL (PF) 0.25 % IJ SOLN
INTRAMUSCULAR | Status: AC
  Filled 2023-05-16: qty 30

## 2023-05-16 MED ORDER — GABAPENTIN 300 MG PO CAPS
ORAL_CAPSULE | ORAL | Status: AC
  Filled 2023-05-16: qty 1

## 2023-05-16 MED ORDER — FENTANYL CITRATE (PF) 100 MCG/2ML IJ SOLN
25.0000 ug | INTRAMUSCULAR | Status: DC | PRN
  Administered 2023-05-16 (×2): 50 ug via INTRAVENOUS

## 2023-05-16 MED ORDER — ACETAMINOPHEN 500 MG PO TABS
ORAL_TABLET | ORAL | Status: AC
  Filled 2023-05-16: qty 2

## 2023-05-16 MED ORDER — FENTANYL CITRATE (PF) 100 MCG/2ML IJ SOLN
INTRAMUSCULAR | Status: AC
  Filled 2023-05-16: qty 2

## 2023-05-16 MED ORDER — CELECOXIB 100 MG PO CAPS: 100.0000 mg | ORAL_CAPSULE | Freq: Two times a day (BID) | ORAL | 0 refills | Status: AC

## 2023-05-16 MED ORDER — OXYCODONE HCL 5 MG PO TABS: ORAL_TABLET | ORAL | 0 refills | Status: AC

## 2023-05-16 MED ORDER — MIDAZOLAM HCL 2 MG/2ML IJ SOLN
INTRAMUSCULAR | Status: AC
Start: 2023-05-16 — End: ?
  Filled 2023-05-16: qty 2

## 2023-05-16 MED ORDER — ONDANSETRON HCL 4 MG PO TABS: 4.0000 mg | ORAL_TABLET | Freq: Three times a day (TID) | ORAL | 0 refills | Status: AC | PRN

## 2023-05-16 MED ORDER — DEXAMETHASONE SODIUM PHOSPHATE 10 MG/ML IJ SOLN
INTRAMUSCULAR | Status: AC
  Filled 2023-05-16: qty 1

## 2023-05-16 MED ORDER — TRANEXAMIC ACID-NACL 1000-0.7 MG/100ML-% IV SOLN
1000.0000 mg | INTRAVENOUS | Status: AC
  Administered 2023-05-16: 1000 mg via INTRAVENOUS

## 2023-05-16 MED ORDER — ASPIRIN 81 MG PO CHEW: 81.0000 mg | CHEWABLE_TABLET | Freq: Two times a day (BID) | ORAL | 0 refills | Status: AC

## 2023-05-16 MED ORDER — PROPOFOL 10 MG/ML IV BOLUS
INTRAVENOUS | Status: AC
  Filled 2023-05-16: qty 20

## 2023-05-16 MED ORDER — LIDOCAINE 2% (20 MG/ML) 5 ML SYRINGE
INTRAMUSCULAR | Status: DC | PRN
  Administered 2023-05-16: 80 mg via INTRAVENOUS

## 2023-05-16 MED ORDER — VANCOMYCIN HCL 1000 MG IV SOLR
INTRAVENOUS | Status: AC
  Filled 2023-05-16: qty 20

## 2023-05-16 MED ORDER — LACTATED RINGERS IV SOLN: INTRAVENOUS | Status: DC

## 2023-05-16 MED ORDER — SUGAMMADEX SODIUM 200 MG/2ML IV SOLN
INTRAVENOUS | Status: DC | PRN
  Administered 2023-05-16: 200 mg via INTRAVENOUS

## 2023-05-16 MED ORDER — TRANEXAMIC ACID-NACL 1000-0.7 MG/100ML-% IV SOLN
INTRAVENOUS | Status: AC
  Filled 2023-05-16: qty 100

## 2023-05-16 MED ORDER — VANCOMYCIN HCL 1000 MG IV SOLR
INTRAVENOUS | Status: DC | PRN
  Administered 2023-05-16: 1000 mg via TOPICAL

## 2023-05-16 MED ORDER — ONDANSETRON HCL 4 MG/2ML IJ SOLN
INTRAMUSCULAR | Status: AC
  Filled 2023-05-16: qty 2

## 2023-05-16 MED ORDER — BUPIVACAINE HCL (PF) 0.5 % IJ SOLN
INTRAMUSCULAR | Status: DC | PRN
  Administered 2023-05-16: 10 mL via PERINEURAL

## 2023-05-16 SURGICAL SUPPLY — 56 items
BLADE SAW SGTL 73X25 THK (BLADE) ×1 IMPLANT
BLADE SURG 10 STRL SS (BLADE) IMPLANT
BLADE SURG 15 STRL LF DISP TIS (BLADE) IMPLANT
CHLORAPREP W/TINT 26 (MISCELLANEOUS) ×1 IMPLANT
CLSR STERI-STRIP ANTIMIC 1/2X4 (GAUZE/BANDAGES/DRESSINGS) ×1 IMPLANT
COOLER ICEMAN CLASSIC (MISCELLANEOUS) ×1 IMPLANT
COVER BACK TABLE 60X90IN (DRAPES) ×1 IMPLANT
COVER MAYO STAND STRL (DRAPES) ×1 IMPLANT
DRAPE IMP U-DRAPE 54X76 (DRAPES) IMPLANT
DRAPE INCISE IOBAN 66X45 STRL (DRAPES) ×1 IMPLANT
DRAPE POUCH INSTRU U-SHP 10X18 (DRAPES) ×1 IMPLANT
DRAPE U-SHAPE 76X120 STRL (DRAPES) ×2 IMPLANT
DRSG AQUACEL AG ADV 3.5X 6 (GAUZE/BANDAGES/DRESSINGS) ×1 IMPLANT
ELECT BLADE 4.0 EZ CLEAN MEGAD (MISCELLANEOUS) ×1 IMPLANT
ELECT REM PT RETURN 9FT ADLT (ELECTROSURGICAL) ×1 IMPLANT
ELECTRODE BLDE 4.0 EZ CLN MEGD (MISCELLANEOUS) ×1 IMPLANT
ELECTRODE REM PT RTRN 9FT ADLT (ELECTROSURGICAL) ×1 IMPLANT
FACESHIELD WRAPAROUND (MASK) ×2 IMPLANT
FACESHIELD WRAPAROUND OR TEAM (MASK) ×2 IMPLANT
GAUZE XEROFORM 1X8 LF (GAUZE/BANDAGES/DRESSINGS) IMPLANT
GLOVE BIO SURGEON STRL SZ 6.5 (GLOVE) ×2 IMPLANT
GLOVE BIOGEL PI IND STRL 6.5 (GLOVE) ×1 IMPLANT
GLOVE BIOGEL PI IND STRL 8 (GLOVE) ×1 IMPLANT
GLOVE ECLIPSE 8.0 STRL XLNG CF (GLOVE) ×3 IMPLANT
GOWN STRL REUS W/ TWL LRG LVL3 (GOWN DISPOSABLE) ×2 IMPLANT
GOWN STRL REUS W/TWL XL LVL3 (GOWN DISPOSABLE) ×1 IMPLANT
HEAD HUMERAL PYRO LOW 43X16 (Orthopedic Implant) IMPLANT
KIT STABILIZATION SHOULDER (MISCELLANEOUS) ×1 IMPLANT
MANIFOLD NEPTUNE II (INSTRUMENTS) ×1 IMPLANT
NDL MAYO TROCAR (NEEDLE) ×1 IMPLANT
NEEDLE MAYO TROCAR (NEEDLE) ×1 IMPLANT
NS IRRIG 1000ML POUR BTL (IV SOLUTION) ×1 IMPLANT
PACK BASIN DAY SURGERY FS (CUSTOM PROCEDURE TRAY) ×1 IMPLANT
PACK SHOULDER (CUSTOM PROCEDURE TRAY) ×1 IMPLANT
PAD COLD SHLDR WRAP-ON (PAD) ×1 IMPLANT
PENCIL SMOKE EVACUATOR (MISCELLANEOUS) IMPLANT
RESTRAINT HEAD UNIVERSAL NS (MISCELLANEOUS) ×1 IMPLANT
SET HNDPC FAN SPRY TIP SCT (DISPOSABLE) ×1 IMPLANT
SHEET MEDIUM DRAPE 40X70 STRL (DRAPES) ×1 IMPLANT
SLEEVE SCD COMPRESS KNEE MED (STOCKING) ×1 IMPLANT
SMARTMIX MINI TOWER (MISCELLANEOUS) IMPLANT
SPONGE T-LAP 18X18 ~~LOC~~+RFID (SPONGE) IMPLANT
STEM HUMERAL PTC STND SZ2A (Joint) ×1 IMPLANT
STEM HUMERAL PTCSTD SZ2A (Joint) IMPLANT
SUT ETHIBOND 2 V 37 (SUTURE) ×1 IMPLANT
SUT ETHIBOND NAB CT1 #1 30IN (SUTURE) ×1 IMPLANT
SUT ETHILON 3 0 PS 1 (SUTURE) IMPLANT
SUT FIBERWIRE #2 38 REV NDL BL (SUTURE) ×1 IMPLANT
SUT FIBERWIRE #5 38 CONV NDL (SUTURE) ×10 IMPLANT
SUT MNCRL AB 4-0 PS2 18 (SUTURE) ×1 IMPLANT
SUT VIC AB 3-0 SH 27X BRD (SUTURE) ×1 IMPLANT
SUTURE FIBERWR #5 38 CONV NDL (SUTURE) ×6 IMPLANT
SUTURE FIBERWR#2 38 REV NDL BL (SUTURE) IMPLANT
TOWEL GREEN STERILE FF (TOWEL DISPOSABLE) ×3 IMPLANT
TOWER SMARTMIX MINI (MISCELLANEOUS) ×1 IMPLANT
TUBE SUCTION HIGH CAP CLEAR NV (SUCTIONS) ×1 IMPLANT

## 2023-05-16 NOTE — Progress Notes (Signed)
Assisted Dr. Turk with right, interscalene , ultrasound guided block. Side rails up, monitors on throughout procedure. See vital signs in flow sheet. Tolerated Procedure well. 

## 2023-05-16 NOTE — Op Note (Signed)
 Orthopaedic Surgery Operative Note (CSN: [id number])  Danny Lyons.  09-Apr-1970 Date of Surgery: 05/16/2023   Diagnoses:  Right shoulder avascular necrosis  Procedure: Right shoulder pyrocarbon hemiarthroplasty   Operative Finding Successful completion of planned procedure.  Patient had dense AVN of the humeral bone and collapse of the head.  Glenoid is relatively preserved.  Even with capsular release the shoulder was quite stiff.  We had to perform a extensive release of the subscapularis taking care to avoid damage to the neurovascular structures to obtain reasonable mobility.  Post-operative plan: The patient will be NWB in sling.  The patient will be will be discharged from PACU if continues to be stable as was plan prior to surgery.  DVT prophylaxis Aspirin 81 mg twice daily for 6 weeks.  Pain control with PRN pain medication preferring oral medicines.  Follow up plan will be scheduled in approximately 7 days for incision check and XR.  Physical therapy to start immediately.  Implants: Tornier flex size to a stem, 43 low offset pyrocarbon head  Post-Op Diagnosis: Same Surgeons:Primary: Bjorn Pippin, MD Assistants:Caroline McBane, PA-C Location: [facility] OR ROOM 1 Anesthesia: General with Exparel Interscalene Antibiotics: Ancef 2g preop, Vancomycin 1000mg  locally Tourniquet time: None Estimated Blood Loss: 100 Complications: None Specimens: None Implants: Implant Name Type Inv. Item Serial No. Manufacturer Lot No. LRB No. Used Action  HEAD HUMERAL PYRO LOW [id number] - [id number] Orthopedic Implant HEAD HUMERAL PYRO LOW [id number] [employer]  Right 1 Implanted  STEM HUMERAL PTC STND SZ2A - [id number] Joint STEM HUMERAL PTC STND SZ2A [id number] [employer]  Right 1 Implanted    Indications for Surgery:   Danny Lyons. is a 53 y.o. male with avascular necrosis of the humeral head.  Benefits and risks of operative and nonoperative management were discussed prior to surgery  with patient/guardian(s) and informed consent form was completed.  Infection and need for further surgery were discussed as was prosthetic stability and cuff issues.  We additionally specifically discussed risks of axillary nerve injury, infection, periprosthetic fracture, continued pain and longevity of implants prior to beginning procedure.      Procedure:   The patient was identified in the preoperative holding area where the surgical site was marked. Block placed by anesthesia with exparel.  The patient was taken to the OR where a procedural timeout was called and the above noted anesthesia was induced.  The patient was positioned beachchair on allen table with spider arm positioner.  Preoperative antibiotics were dosed.  The patient's right shoulder was prepped and draped in the usual sterile fashion.  A second preoperative timeout was called.       Standard deltopectoral approach was performed with a #10 blade. We dissected down to the subcutaneous tissues and the cephalic vein was taken laterally with the deltoid. Clavipectoral fascia was incised in line with the incision. Deep retractors were placed. The long of the biceps tendon was identified and there was significant tenosynovitis present.  Tenodesis was performed to the pectoralis tendon with #2 Ethibond. The remaining biceps was followed up into the rotator interval where it was released. The subscapularis was taken down with a lesser tuberosity osteotomy using an osteotome. The osteotomy fragment and underlying capsular elevated off of the humeral neck and the osteophytes inferiorly. #2 Ethibond sutures are passed through the bone tendon junction for subscap manipulation. We continued releasing the capsule directly off of the osteophytes inferiorly all the way around the corner. This allowed [place] to  dislocate the humeral head. The humeral head had evidence of severe osteoarthritic wear with full-thickness cartilage loss and exposed subchondral  bone. There was significant flattening of the humeral head.   The rotator cuff was carefully examined and noted to be intact without sign of wear.  The decision was confirmed that an hemiarthroplasty shoulder was indicated for this patient.  There were osteophytes along the inferior humeral neck. The osteophytes were removed with an osteotome and a rongeur.  Osteophytes were removed with a rongeur and an osteotome and the anatomic neck was well visualized.   We next made our humeral osteotomy with an oscillating saw along the anatomic neck. The head fragment was passed off the back table and measured approximately 45 mm in diameter.   A starter awl was used to open the humeral canal. We reamed with T-handle sound  reamers up an appropriate fit. The chisel was used to remove proximal humeral bone. We then broached starting with a size 1 broach increasing to size 2 which had secure and stable fit. The inclination was set at C. The broach handle was removed and a cut protector was placed.   We trialed with multiple size head options and selected a 43 which re-created the patient's anatomy. The offset was dialed in to match the normal anatomy. The shoulder was trialed.  There was good ROM in all planes and the shoulder was stable with approximately 50% posterior spring back and no inferior translation.  The real humeral implants were opened on the back table and assembled.  The trial was removed. #5 Fiberwire sutures passed through the humeral neck for subscap repair. The humeral component was press-fit obtaining a secure fit.  The joint was reduced and thoroughly irrigated with pulsatile lavage. Subscap and lesser tuberosity osteotomy repaired back in a double row fashion with #5 Fiberwire sutures through bone tunnels. Next the rotator interval was closed with #2 ethibond suture. Hemostasis was obtained. The deltopectoral interval was reapproximated with #1 Ethibond. The subcutaneous tissues were closed with  2-0 Vicryl and the skin was closed with a running monocryl. The incisions were cleaned and dried and an Aquacel dressing was placed. The drapes taken down. The arm was placed into sling with abduction pillow. Patient was awakened, extubated, and transferred to the recovery room in stable condition. There were no intraoperative complications. The sponge, needle, and attention counts were correct at the end of the case.     Alfonse Alpers, PA-C, present and scrubbed throughout the case, critical for completion in a timely fashion, and for retraction, instrumentation, closure.

## 2023-05-16 NOTE — Interval H&P Note (Signed)
 All questions answered, patient wants to proceed with procedure. ? ?

## 2023-05-16 NOTE — Transfer of Care (Signed)
 Immediate Anesthesia Transfer of Care Note  Patient: Danny Lyons.  Procedure(s) Performed: SHOULDER HEMIARTHROPLASTY (Right: Shoulder)  Patient Location: PACU  Anesthesia Type:General  Level of Consciousness: awake and patient cooperative  Airway & Oxygen Therapy: Patient Spontanous Breathing and Patient connected to face mask oxygen  Post-op Assessment: Report given to RN and Post -op Vital signs reviewed and stable  Post vital signs: Reviewed and stable  Last Vitals:  Vitals Value Taken Time  BP 145/90 05/16/23 0922  Temp    Pulse 86 05/16/23 0927  Resp 20 05/16/23 0927  SpO2 97 % 05/16/23 0927  Vitals shown include unfiled device data.  Last Pain:  Vitals:   05/16/23 0630  TempSrc: Oral  PainSc: 7       Patients Stated Pain Goal: 7 (05/16/23 0630)  Complications: No notable events documented.

## 2023-05-16 NOTE — Anesthesia Preprocedure Evaluation (Addendum)
 Anesthesia Evaluation  Patient identified by MRN, date of birth, ID band Patient awake    Reviewed: Allergy & Precautions, NPO status , Patient's Chart, lab work & pertinent test results  Airway Mallampati: II  TM Distance: >3 FB Neck ROM: Full    Dental  (+) Dental Advisory Given, Partial Upper   Pulmonary Current Smoker and Patient abstained from smoking.   Pulmonary exam normal breath sounds clear to auscultation       Cardiovascular hypertension, Pt. on medications Normal cardiovascular exam Rhythm:Regular Rate:Normal     Neuro/Psych  PSYCHIATRIC DISORDERS Anxiety Depression    negative neurological ROS     GI/Hepatic negative GI ROS, Neg liver ROS,,,  Endo/Other  negative endocrine ROS    Renal/GU negative Renal ROS     Musculoskeletal  (+) Arthritis , Osteoarthritis,    Abdominal   Peds  Hematology negative hematology ROS (+)   Anesthesia Other Findings Day of surgery medications reviewed with the patient.  Reproductive/Obstetrics                             Anesthesia Physical Anesthesia Plan  ASA: 2  Anesthesia Plan: General   Post-op Pain Management: Tylenol PO (pre-op)* and Regional block*   Induction: Intravenous  PONV Risk Score and Plan: 2 and Midazolam, Dexamethasone and Ondansetron  Airway Management Planned: Oral ETT  Additional Equipment:   Intra-op Plan:   Post-operative Plan: Extubation in OR  Informed Consent: I have reviewed the patients History and Physical, chart, labs and discussed the procedure including the risks, benefits and alternatives for the proposed anesthesia with the patient or authorized representative who has indicated his/her understanding and acceptance.     Dental advisory given  Plan Discussed with: CRNA  Anesthesia Plan Comments:         Anesthesia Quick Evaluation

## 2023-05-16 NOTE — Anesthesia Postprocedure Evaluation (Signed)
 Anesthesia Post Note  Patient: Gevork Ayyad.  Procedure(s) Performed: SHOULDER HEMIARTHROPLASTY (Right: Shoulder)     Patient location during evaluation: PACU Anesthesia Type: General Level of consciousness: awake and alert Pain management: pain level controlled Vital Signs Assessment: post-procedure vital signs reviewed and stable Respiratory status: spontaneous breathing, nonlabored ventilation and respiratory function stable Cardiovascular status: blood pressure returned to baseline and stable Postop Assessment: no apparent nausea or vomiting Anesthetic complications: no   No notable events documented.  Last Vitals:  Vitals:   05/16/23 1000 05/16/23 1010  BP: (!) 121/92 (!) 135/92  Pulse: 95 95  Resp: 13 14  Temp:  (!) 36.2 C  SpO2: 92% 94%    Last Pain:  Vitals:   05/16/23 1010  TempSrc:   PainSc: 0-No pain                 Collene Schlichter

## 2023-05-16 NOTE — Anesthesia Procedure Notes (Signed)
 Procedure Name: Intubation Date/Time: 05/16/2023 7:55 AM  Performed by: Collene Schlichter, MDPre-anesthesia Checklist: Patient identified, Emergency Drugs available, Suction available and Patient being monitored Patient Re-evaluated:Patient Re-evaluated prior to induction Oxygen Delivery Method: Circle system utilized Preoxygenation: Pre-oxygenation with 100% oxygen Induction Type: IV induction Ventilation: Mask ventilation without difficulty Laryngoscope Size: 4 and Mac Grade View: Grade II Tube type: Oral Number of attempts: 1 Airway Equipment and Method: Stylet and Oral airway Placement Confirmation: ETT inserted through vocal cords under direct vision, positive ETCO2 and breath sounds checked- equal and bilateral Tube secured with: Tape Dental Injury: Teeth and Oropharynx as per pre-operative assessment  Future Recommendations: Recommend- induction with short-acting agent, and alternative techniques readily available Comments: Attempt x1 by CRNA with Grade3 view, esophageal intubation.  Attempt x1 by MDA with Grade 2 view, successful intubation.

## 2023-05-16 NOTE — Anesthesia Procedure Notes (Signed)
 Anesthesia Regional Block: Interscalene brachial plexus block   Pre-Anesthetic Checklist: , timeout performed,  Correct Patient, Correct Site, Correct Laterality,  Correct Procedure, Correct Position, site marked,  Risks and benefits discussed,  Surgical consent,  Pre-op evaluation,  At surgeon's request and post-op pain management  Laterality: Right  Prep: chloraprep       Needles:  Injection technique: Single-shot  Needle Type: Echogenic Stimulator Needle     Needle Length: 9cm  Needle Gauge: 22     Additional Needles:   Procedures:,,,, ultrasound used (permanent image in chart),,    Narrative:  Start time: 05/16/2023 6:55 AM End time: 05/16/2023 7:01 AM Injection made incrementally with aspirations every 5 mL.  Performed by: Personally  Anesthesiologist: Collene Schlichter, MD  Additional Notes: Functioning IV was confirmed and monitors were applied.  A 90mm 22ga echogenic stimulator needle was used. Sterile prep and drape, hand hygiene, and sterile gloves were used.  Negative aspiration and negative test dose prior to incremental administration of local anesthetic. The patient tolerated the procedure well.  Ultrasound guidance: relevent anatomy identified, needle position confirmed, local anesthetic spread visualized around nerve(s), vascular puncture avoided.  Image printed for medical record.

## 2023-05-17 ENCOUNTER — Encounter (HOSPITAL_BASED_OUTPATIENT_CLINIC_OR_DEPARTMENT_OTHER): Payer: Self-pay | Admitting: Orthopaedic Surgery

## 2023-05-20 ENCOUNTER — Ambulatory Visit: Payer: Managed Care, Other (non HMO) | Admitting: Sports Medicine

## 2023-05-24 ENCOUNTER — Ambulatory Visit: Payer: Managed Care, Other (non HMO) | Admitting: Sports Medicine

## 2023-06-22 ENCOUNTER — Other Ambulatory Visit: Payer: Self-pay | Admitting: Family Medicine

## 2023-06-28 ENCOUNTER — Ambulatory Visit (INDEPENDENT_AMBULATORY_CARE_PROVIDER_SITE_OTHER): Payer: Managed Care, Other (non HMO) | Admitting: Family Medicine

## 2023-06-28 ENCOUNTER — Encounter: Payer: Self-pay | Admitting: Family Medicine

## 2023-06-28 VITALS — BP 139/88 | HR 76 | Ht 68.0 in | Wt 190.0 lb

## 2023-06-28 DIAGNOSIS — I10 Essential (primary) hypertension: Secondary | ICD-10-CM

## 2023-06-28 DIAGNOSIS — M19011 Primary osteoarthritis, right shoulder: Secondary | ICD-10-CM

## 2023-06-28 MED ORDER — METHOCARBAMOL 750 MG PO TABS: 750.0000 mg | ORAL_TABLET | Freq: Three times a day (TID) | ORAL | 0 refills | Status: AC | PRN

## 2023-06-28 NOTE — Assessment & Plan Note (Signed)
 S/p hemiarthroplasty.  Having continued pain and spasm.  Robaxin renewed at increased strength of 750mg  q8 hours.  Continue meloxicam.  Encouraged follow up with surgeon.

## 2023-06-28 NOTE — Assessment & Plan Note (Addendum)
 BP is fairly well controlled.  Continue candesartan/hctz and amlodipine at current strength.  Low sodium diet and avoidance of nicotine products recommended.

## 2023-06-28 NOTE — Progress Notes (Signed)
 Danny Lyons. - 53 y.o. male MRN [id number]  Date of birth: [birth year]  Subjective Chief Complaint  Patient presents with   Medical Management of Chronic Issues    HPI Danny Harn. is  53 y.o. male here today for follow up visit.   He reports that he is having continued pain and spasm in the bicep.  S/p R shoulder heimarthroplasty in February.  He has used robaxin which helps some.  Also using meloxicam.  He doesn't see his surgeon again until May.  Hasn't really spoken to him about what he is currently experiencing.    BP is borderline today.  He has been off of candesartan for a few days.  He plans to pick his up today.   He denies chest pain, shortness of breath, palpitations, headache or vision changes.  ROS:  A comprehensive ROS was completed and negative except as noted per HPI  No Known Allergies  Past Medical History:  Diagnosis Date   Anxiety    Closed rib fracture 2017   right   Collar bone fracture 2017   Depression    Hypertension    Insomnia    Overweight    Prediabetes    Tobacco use     Past Surgical History:  Procedure Laterality Date   NO PAST SURGERIES     SHOULDER HEMI-ARTHROPLASTY Right 05/16/2023   Procedure: SHOULDER HEMIARTHROPLASTY;  Surgeon: Bjorn Pippin, MD;  Location: [facility];  Service: Orthopedics;  Laterality: Right;    Social History   Socioeconomic History   Marital status: Significant Other    Spouse name: Not on file   Number of children: Not on file   Years of education: Not on file   Highest education level: Not on file  Occupational History   Not on file  Tobacco Use   Smoking status: Every Day    Current packs/day: 0.50    Average packs/day: 0.5 packs/day for 21.0 years (10.5 ttl pk-yrs)    Types: Cigarettes   Smokeless tobacco: Never  Vaping Use   Vaping status: Never Used  Substance and Sexual Activity   Alcohol use: Yes    Comment: occ   Drug use: Never   Sexual activity: Yes  Other Topics  Concern   Not on file  Social History Narrative   Not on file   Social Drivers of Health   Financial Resource Strain: Not on file  Food Insecurity: Not on file  Transportation Needs: Not on file  Physical Activity: Not on file  Stress: Not on file  Social Connections: Unknown (08/04/2021)   Received from [employer], [facility]   Social Network    Social Network: Not on file    Family History  Problem Relation Age of Onset   Diabetes Mother    Hypertension Mother    Heart attack Mother    Hypertension Father    Diabetes Father    Heart attack Father    Cancer Father    Heart attack Maternal Grandmother    Heart attack Maternal Grandfather    Heart attack Paternal Grandmother    Heart attack Paternal Grandfather     Health Maintenance  Topic Date Due   COVID-19 Vaccine (1) Never done   Pneumococcal Vaccine 55-47 Years old (1 of 2 - PCV) Never done   HIV Screening  Never done   Hepatitis C Screening  Never done   Zoster Vaccines- Shingrix (1 of 2) Never done  Colonoscopy  Never done   INFLUENZA VACCINE  10/25/2023   DTaP/Tdap/Td (2 - Td or Tdap) 11/23/2023   HPV VACCINES  Aged Out     ----------------------------------------------------------------------------------------------------------------------------------------------------------------------------------------------------------------- Physical Exam BP 139/88 (BP Location: Left Arm, Patient Position: Sitting, Cuff Size: Large)   Pulse 76   Ht 5\' 8"  (1.727 m)   Wt 190 lb (86.2 kg)   SpO2 98%   BMI 28.89 kg/m   Physical Exam Constitutional:      Appearance: Normal appearance.  Eyes:     General: No scleral icterus. Cardiovascular:     Rate and Rhythm: Normal rate and regular rhythm.  Pulmonary:     Effort: Pulmonary effort is normal.     Breath sounds: Normal breath sounds.  Musculoskeletal:     Cervical back: Neck supple.  Neurological:     Mental Status: He is alert.  Psychiatric:         Mood and Affect: Mood normal.        Behavior: Behavior normal.     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  Having continued pain and spasm.  Robaxin renewed at increased strength of 750mg  q8 hours.  Continue meloxicam.  Encouraged follow up with surgeon.     Hypertension goal BP (blood pressure) < 130/80 BP is fairly well controlled.  Continue candesartan/hctz and amlodipine at current strength.  Low sodium diet and avoidance of nicotine products recommended.     Meds ordered this encounter  Medications   methocarbamol (ROBAXIN-750) 750 MG tablet    Sig: Take 1 tablet (750 mg total) by mouth every 8 (eight) hours as needed for muscle spasms.    Dispense:  60 tablet    Refill:  0    Return in about 6 months (around 12/28/2023) for Hypertension.

## 2023-07-29 ENCOUNTER — Other Ambulatory Visit: Payer: Self-pay | Admitting: Family Medicine

## 2023-07-29 DIAGNOSIS — R7303 Prediabetes: Secondary | ICD-10-CM

## 2023-07-29 DIAGNOSIS — I10 Essential (primary) hypertension: Secondary | ICD-10-CM

## 2023-07-29 DIAGNOSIS — E785 Hyperlipidemia, unspecified: Secondary | ICD-10-CM

## 2023-07-30 MED ORDER — AMLODIPINE BESYLATE 5 MG PO TABS: 5.0000 mg | ORAL_TABLET | Freq: Every day | ORAL | 1 refills | Status: AC

## 2023-07-30 MED ORDER — CANDESARTAN CILEXETIL 32 MG PO TABS: 32.0000 mg | ORAL_TABLET | Freq: Every day | ORAL | 1 refills | Status: AC

## 2023-07-30 MED ORDER — ATORVASTATIN CALCIUM 40 MG PO TABS: 40.0000 mg | ORAL_TABLET | Freq: Every day | ORAL | 3 refills | Status: AC

## 2023-08-13 ENCOUNTER — Other Ambulatory Visit (INDEPENDENT_AMBULATORY_CARE_PROVIDER_SITE_OTHER)

## 2023-08-13 ENCOUNTER — Ambulatory Visit (INDEPENDENT_AMBULATORY_CARE_PROVIDER_SITE_OTHER): Admitting: Sports Medicine

## 2023-08-13 ENCOUNTER — Encounter: Payer: Self-pay | Admitting: Sports Medicine

## 2023-08-13 DIAGNOSIS — M7542 Impingement syndrome of left shoulder: Secondary | ICD-10-CM | POA: Diagnosis not present

## 2023-08-13 DIAGNOSIS — M65311 Trigger thumb, right thumb: Secondary | ICD-10-CM | POA: Diagnosis not present

## 2023-08-13 DIAGNOSIS — M65312 Trigger thumb, left thumb: Secondary | ICD-10-CM | POA: Diagnosis not present

## 2023-08-13 MED ORDER — TRIAMCINOLONE ACETONIDE 40 MG/ML IJ SUSP
40.0000 mg | Freq: Once | INTRAMUSCULAR | Status: AC
  Administered 2023-08-13: 40 mg via INTRAMUSCULAR

## 2023-08-13 NOTE — Progress Notes (Signed)
    Procedures performed today:    Procedure: Real-time Ultrasound Guided injection of the left flexor pollicis longus tendon sheath Device: Samsung HS60  Verbal informed consent obtained.  Time-out conducted.  Noted no overlying erythema, induration, or other signs of local infection.  Skin prepped in a sterile fashion.  Local anesthesia: Topical Ethyl chloride.  With sterile technique and under real time ultrasound guidance: Noted enlarged tendon nodule and mild sheath effusion, 1/2 cc lidocaine , 1/2 cc kenalog  40 injected easily. Completed without difficulty  Advised to call if fevers/chills, erythema, induration, drainage, or persistent bleeding.  Images permanently stored and available for review in PACS.  Impression: Technically successful ultrasound guided injection.  Independent interpretation of notes and tests performed by another provider:   None.  Brief History, Exam, Impression, and Recommendations:    Impingement syndrome, shoulder, left Pleasant 53 year old male, increasing pain left shoulder over the deltoid for the most part minimal impingement signs but good motion, good strength, suspect more of a shoulder bursitis. Home conditioning and x-rays ordered, if not better in 6 weeks we will do a subacromial injection.  Trigger thumb of both hands Recurrence of left-sided trigger thumb, his last injection was in September 2024, repeat left FPL sheath injection today, return as needed for this.    ____________________________________________ Joselyn Nicely. Sandy Crumb, M.D., ABFM., CAQSM., AME. Primary Care and Sports Medicine [facility]  Adjunct Professor of [facility] [employer] [facility] Medicine  [employer]  Restaurant manager, fast food

## 2023-08-13 NOTE — Assessment & Plan Note (Signed)
 Recurrence of left-sided trigger thumb, his last injection was in September 2024, repeat left FPL sheath injection today, return as needed for this.

## 2023-08-13 NOTE — Assessment & Plan Note (Signed)
 Pleasant 53 year old male, increasing pain left shoulder over the deltoid for the most part minimal impingement signs but good motion, good strength, suspect more of a shoulder bursitis. Home conditioning and x-rays ordered, if not better in 6 weeks we will do a subacromial injection.

## 2023-09-09 ENCOUNTER — Ambulatory Visit

## 2023-09-09 DIAGNOSIS — M25512 Pain in left shoulder: Secondary | ICD-10-CM

## 2023-09-09 DIAGNOSIS — M7542 Impingement syndrome of left shoulder: Secondary | ICD-10-CM

## 2023-11-26 ENCOUNTER — Encounter: Payer: Self-pay | Admitting: Sports Medicine

## 2023-12-27 ENCOUNTER — Ambulatory Visit: Admitting: Family Medicine
